# Patient Record
Sex: Female | Born: 1980 | Race: Black or African American | Hispanic: No | Marital: Married | Smoking: Never smoker
Health system: Southern US, Community
[De-identification: ages and names within clinical notes are randomized; demographics above are authoritative.]

## PROBLEM LIST (undated history)

## (undated) ENCOUNTER — Inpatient Hospital Stay (HOSPITAL_COMMUNITY): Payer: Self-pay

## (undated) DIAGNOSIS — K219 Gastro-esophageal reflux disease without esophagitis: Secondary | ICD-10-CM

## (undated) DIAGNOSIS — D649 Anemia, unspecified: Secondary | ICD-10-CM

## (undated) DIAGNOSIS — T50A95A Adverse effect of other bacterial vaccines, initial encounter: Secondary | ICD-10-CM

## (undated) DIAGNOSIS — D573 Sickle-cell trait: Secondary | ICD-10-CM

## (undated) DIAGNOSIS — R7611 Nonspecific reaction to tuberculin skin test without active tuberculosis: Secondary | ICD-10-CM

## (undated) DIAGNOSIS — O4290 Premature rupture of membranes, unspecified as to length of time between rupture and onset of labor, unspecified weeks of gestation: Secondary | ICD-10-CM

## (undated) DIAGNOSIS — N883 Incompetence of cervix uteri: Secondary | ICD-10-CM

## (undated) DIAGNOSIS — Z5189 Encounter for other specified aftercare: Secondary | ICD-10-CM

## (undated) HISTORY — PX: TUBAL LIGATION: SHX77

## (undated) HISTORY — DX: Sickle-cell trait: D57.3

## (undated) HISTORY — PX: CERVICAL CERCLAGE: SHX1329

## (undated) HISTORY — DX: Anemia, unspecified: D64.9

---

## 2004-12-25 ENCOUNTER — Emergency Department (HOSPITAL_COMMUNITY): Admission: EM | Admit: 2004-12-25 | Discharge: 2004-12-25 | Payer: Self-pay | Admitting: Emergency Medicine

## 2005-03-20 ENCOUNTER — Emergency Department (HOSPITAL_COMMUNITY): Admission: EM | Admit: 2005-03-20 | Discharge: 2005-03-20 | Payer: Self-pay | Admitting: Emergency Medicine

## 2005-11-08 ENCOUNTER — Inpatient Hospital Stay (HOSPITAL_COMMUNITY): Admission: AD | Admit: 2005-11-08 | Discharge: 2005-11-13 | Payer: Self-pay | Admitting: Obstetrics and Gynecology

## 2005-11-10 ENCOUNTER — Encounter (INDEPENDENT_AMBULATORY_CARE_PROVIDER_SITE_OTHER): Payer: Self-pay | Admitting: Specialist

## 2006-07-01 ENCOUNTER — Inpatient Hospital Stay (HOSPITAL_COMMUNITY): Admission: AD | Admit: 2006-07-01 | Discharge: 2006-07-11 | Payer: Self-pay | Admitting: Obstetrics and Gynecology

## 2006-07-04 ENCOUNTER — Ambulatory Visit: Payer: Self-pay | Admitting: Neonatology

## 2006-07-10 ENCOUNTER — Encounter (INDEPENDENT_AMBULATORY_CARE_PROVIDER_SITE_OTHER): Payer: Self-pay | Admitting: Specialist

## 2006-07-10 ENCOUNTER — Ambulatory Visit: Payer: Self-pay | Admitting: Neonatology

## 2007-07-05 ENCOUNTER — Inpatient Hospital Stay (HOSPITAL_COMMUNITY): Admission: AD | Admit: 2007-07-05 | Discharge: 2007-07-05 | Payer: Self-pay | Admitting: Obstetrics and Gynecology

## 2007-07-31 ENCOUNTER — Ambulatory Visit (HOSPITAL_COMMUNITY): Admission: RE | Admit: 2007-07-31 | Discharge: 2007-07-31 | Payer: Self-pay | Admitting: Obstetrics and Gynecology

## 2007-09-22 ENCOUNTER — Inpatient Hospital Stay (HOSPITAL_COMMUNITY): Admission: AD | Admit: 2007-09-22 | Discharge: 2007-09-22 | Payer: Self-pay | Admitting: Obstetrics and Gynecology

## 2007-11-13 ENCOUNTER — Inpatient Hospital Stay (HOSPITAL_COMMUNITY): Admission: AD | Admit: 2007-11-13 | Discharge: 2007-11-21 | Payer: Self-pay | Admitting: Obstetrics & Gynecology

## 2007-11-17 ENCOUNTER — Encounter (INDEPENDENT_AMBULATORY_CARE_PROVIDER_SITE_OTHER): Payer: Self-pay | Admitting: Obstetrics and Gynecology

## 2007-11-22 ENCOUNTER — Encounter: Admission: RE | Admit: 2007-11-22 | Discharge: 2007-12-21 | Payer: Self-pay | Admitting: Obstetrics and Gynecology

## 2007-12-22 ENCOUNTER — Encounter: Admission: RE | Admit: 2007-12-22 | Discharge: 2008-01-21 | Payer: Self-pay | Admitting: Obstetrics and Gynecology

## 2008-01-22 ENCOUNTER — Encounter: Admission: RE | Admit: 2008-01-22 | Discharge: 2008-02-20 | Payer: Self-pay | Admitting: Obstetrics and Gynecology

## 2008-02-21 ENCOUNTER — Encounter: Admission: RE | Admit: 2008-02-21 | Discharge: 2008-03-22 | Payer: Self-pay | Admitting: Obstetrics and Gynecology

## 2008-03-23 ENCOUNTER — Encounter: Admission: RE | Admit: 2008-03-23 | Discharge: 2008-04-22 | Payer: Self-pay | Admitting: Obstetrics and Gynecology

## 2008-04-23 ENCOUNTER — Encounter: Admission: RE | Admit: 2008-04-23 | Discharge: 2008-05-23 | Payer: Self-pay | Admitting: Obstetrics and Gynecology

## 2008-05-24 ENCOUNTER — Encounter: Admission: RE | Admit: 2008-05-24 | Discharge: 2008-06-23 | Payer: Self-pay | Admitting: Obstetrics and Gynecology

## 2008-06-24 ENCOUNTER — Encounter: Admission: RE | Admit: 2008-06-24 | Discharge: 2008-07-23 | Payer: Self-pay | Admitting: Obstetrics and Gynecology

## 2008-07-24 ENCOUNTER — Encounter: Admission: RE | Admit: 2008-07-24 | Discharge: 2008-08-23 | Payer: Self-pay | Admitting: Obstetrics and Gynecology

## 2008-08-24 ENCOUNTER — Encounter: Admission: RE | Admit: 2008-08-24 | Discharge: 2008-09-23 | Payer: Self-pay | Admitting: Obstetrics and Gynecology

## 2008-09-18 ENCOUNTER — Emergency Department (HOSPITAL_COMMUNITY): Admission: EM | Admit: 2008-09-18 | Discharge: 2008-09-18 | Payer: Self-pay | Admitting: Emergency Medicine

## 2009-03-03 ENCOUNTER — Emergency Department (HOSPITAL_COMMUNITY): Admission: EM | Admit: 2009-03-03 | Discharge: 2009-03-04 | Payer: Self-pay | Admitting: Emergency Medicine

## 2009-03-03 ENCOUNTER — Emergency Department (HOSPITAL_COMMUNITY): Admission: EM | Admit: 2009-03-03 | Discharge: 2009-03-03 | Payer: Self-pay | Admitting: Emergency Medicine

## 2010-01-28 ENCOUNTER — Ambulatory Visit (HOSPITAL_COMMUNITY)
Admission: RE | Admit: 2010-01-28 | Discharge: 2010-01-28 | Payer: Self-pay | Source: Home / Self Care | Admitting: Obstetrics and Gynecology

## 2010-02-25 ENCOUNTER — Other Ambulatory Visit: Payer: Self-pay | Admitting: Obstetrics and Gynecology

## 2010-02-25 ENCOUNTER — Emergency Department (HOSPITAL_COMMUNITY): Admission: EM | Admit: 2010-02-25 | Discharge: 2010-02-25 | Payer: Self-pay | Admitting: Emergency Medicine

## 2010-02-25 ENCOUNTER — Ambulatory Visit: Payer: Self-pay | Admitting: Nurse Practitioner

## 2010-03-26 ENCOUNTER — Inpatient Hospital Stay (HOSPITAL_COMMUNITY): Admission: AD | Admit: 2010-03-26 | Discharge: 2010-03-26 | Payer: Self-pay | Admitting: Obstetrics and Gynecology

## 2010-03-26 DIAGNOSIS — O9989 Other specified diseases and conditions complicating pregnancy, childbirth and the puerperium: Secondary | ICD-10-CM

## 2010-03-26 DIAGNOSIS — M545 Low back pain: Secondary | ICD-10-CM

## 2010-04-29 ENCOUNTER — Ambulatory Visit (HOSPITAL_COMMUNITY): Admission: RE | Admit: 2010-04-29 | Discharge: 2010-04-29 | Payer: Self-pay | Admitting: Obstetrics and Gynecology

## 2010-05-09 ENCOUNTER — Inpatient Hospital Stay (HOSPITAL_COMMUNITY): Admission: AD | Admit: 2010-05-09 | Discharge: 2010-05-09 | Payer: Self-pay | Admitting: Obstetrics and Gynecology

## 2010-05-09 ENCOUNTER — Ambulatory Visit: Payer: Self-pay | Admitting: Family

## 2010-05-27 ENCOUNTER — Inpatient Hospital Stay (HOSPITAL_COMMUNITY): Admission: AD | Admit: 2010-05-27 | Discharge: 2010-05-27 | Payer: Self-pay | Admitting: Obstetrics and Gynecology

## 2010-05-27 ENCOUNTER — Encounter (HOSPITAL_COMMUNITY): Payer: Self-pay | Admitting: Obstetrics and Gynecology

## 2010-06-06 ENCOUNTER — Ambulatory Visit (HOSPITAL_COMMUNITY)
Admission: RE | Admit: 2010-06-06 | Discharge: 2010-06-06 | Payer: Self-pay | Source: Home / Self Care | Admitting: Obstetrics and Gynecology

## 2010-06-24 ENCOUNTER — Ambulatory Visit (HOSPITAL_COMMUNITY): Admission: RE | Admit: 2010-06-24 | Discharge: 2010-06-24 | Payer: Self-pay | Admitting: Obstetrics and Gynecology

## 2010-07-27 ENCOUNTER — Inpatient Hospital Stay (HOSPITAL_COMMUNITY)
Admission: RE | Admit: 2010-07-27 | Discharge: 2010-07-30 | Payer: Self-pay | Source: Home / Self Care | Attending: Obstetrics and Gynecology | Admitting: Obstetrics and Gynecology

## 2010-07-30 ENCOUNTER — Encounter
Admission: RE | Admit: 2010-07-30 | Discharge: 2010-08-29 | Payer: Self-pay | Source: Home / Self Care | Attending: Obstetrics and Gynecology | Admitting: Obstetrics and Gynecology

## 2010-08-08 ENCOUNTER — Inpatient Hospital Stay (HOSPITAL_COMMUNITY)
Admission: AD | Admit: 2010-08-08 | Discharge: 2010-08-08 | Payer: Self-pay | Source: Home / Self Care | Attending: Obstetrics and Gynecology | Admitting: Obstetrics and Gynecology

## 2010-08-30 ENCOUNTER — Encounter
Admission: RE | Admit: 2010-08-30 | Discharge: 2010-09-13 | Payer: Self-pay | Source: Home / Self Care | Attending: Obstetrics and Gynecology | Admitting: Obstetrics and Gynecology

## 2010-09-30 ENCOUNTER — Encounter (HOSPITAL_COMMUNITY)
Admission: RE | Admit: 2010-09-30 | Discharge: 2010-09-30 | Disposition: A | Discharge status: Home / Self Care | Payer: Self-pay | Source: Ambulatory Visit | Attending: Obstetrics and Gynecology | Admitting: Obstetrics and Gynecology

## 2010-09-30 DIAGNOSIS — O923 Agalactia: Secondary | ICD-10-CM | POA: Insufficient documentation

## 2010-10-24 LAB — ABO/RH: ABO/RH(D): O POS

## 2010-10-24 LAB — CBC
HCT: 25.8 % — ABNORMAL LOW (ref 36.0–46.0)
HCT: 26.6 % — ABNORMAL LOW (ref 36.0–46.0)
Hemoglobin: 10.6 g/dL — ABNORMAL LOW (ref 12.0–15.0)
Hemoglobin: 8.5 g/dL — ABNORMAL LOW (ref 12.0–15.0)
Hemoglobin: 8.7 g/dL — ABNORMAL LOW (ref 12.0–15.0)
MCH: 24.1 pg — ABNORMAL LOW (ref 26.0–34.0)
MCH: 25.4 pg — ABNORMAL LOW (ref 26.0–34.0)
MCHC: 32.7 g/dL (ref 30.0–36.0)
MCHC: 32.9 g/dL (ref 30.0–36.0)
MCHC: 33.2 g/dL (ref 30.0–36.0)
Platelets: 383 10*3/uL (ref 150–400)
RBC: 3.7 MIL/uL — ABNORMAL LOW (ref 3.87–5.11)
RBC: 4.18 MIL/uL (ref 3.87–5.11)
RDW: 16.9 % — ABNORMAL HIGH (ref 11.5–15.5)

## 2010-10-24 LAB — TYPE AND SCREEN
ABO/RH(D): O POS
Antibody Screen: NEGATIVE

## 2010-10-24 LAB — SURGICAL PCR SCREEN
MRSA, PCR: NEGATIVE
Staphylococcus aureus: NEGATIVE

## 2010-10-24 LAB — RPR: RPR Ser Ql: NONREACTIVE

## 2010-10-27 LAB — URINALYSIS, ROUTINE W REFLEX MICROSCOPIC
Ketones, ur: NEGATIVE mg/dL
Leukocytes, UA: NEGATIVE
Nitrite: NEGATIVE
Protein, ur: NEGATIVE mg/dL
Urobilinogen, UA: 0.2 mg/dL (ref 0.0–1.0)
pH: 6 (ref 5.0–8.0)

## 2010-10-27 LAB — WET PREP, GENITAL: Clue Cells Wet Prep HPF POC: NONE SEEN

## 2010-10-28 LAB — URINALYSIS, ROUTINE W REFLEX MICROSCOPIC
Nitrite: NEGATIVE
Specific Gravity, Urine: 1.015 (ref 1.005–1.030)
Urobilinogen, UA: 0.2 mg/dL (ref 0.0–1.0)

## 2010-10-28 LAB — URINE CULTURE

## 2010-10-28 LAB — URINE MICROSCOPIC-ADD ON

## 2010-10-29 LAB — URINALYSIS, ROUTINE W REFLEX MICROSCOPIC
Ketones, ur: 15 mg/dL — AB
Leukocytes, UA: NEGATIVE
Nitrite: NEGATIVE
Specific Gravity, Urine: 1.015 (ref 1.005–1.030)
Urobilinogen, UA: 0.2 mg/dL (ref 0.0–1.0)
pH: 6 (ref 5.0–8.0)

## 2010-10-29 LAB — URINE MICROSCOPIC-ADD ON

## 2010-10-29 LAB — DIFFERENTIAL
Basophils Absolute: 0 10*3/uL (ref 0.0–0.1)
Lymphocytes Relative: 9 % — ABNORMAL LOW (ref 12–46)
Lymphs Abs: 1.3 10*3/uL (ref 0.7–4.0)
Neutro Abs: 11.6 10*3/uL — ABNORMAL HIGH (ref 1.7–7.7)
Neutrophils Relative %: 85 % — ABNORMAL HIGH (ref 43–77)

## 2010-10-29 LAB — COMPREHENSIVE METABOLIC PANEL
AST: 19 U/L (ref 0–37)
BUN: 7 mg/dL (ref 6–23)
CO2: 24 mEq/L (ref 19–32)
Calcium: 9.5 mg/dL (ref 8.4–10.5)
Chloride: 105 mEq/L (ref 96–112)
Creatinine, Ser: 0.52 mg/dL (ref 0.4–1.2)
GFR calc Af Amer: >60 mL/min (ref >60)
GFR calc non Af Amer: >60 mL/min (ref >60)
Glucose, Bld: 93 mg/dL (ref 70–99)
Total Bilirubin: 0.4 mg/dL (ref 0.3–1.2)

## 2010-10-29 LAB — CBC
Hemoglobin: 11.3 g/dL — ABNORMAL LOW (ref 12.0–15.0)
MCH: 27.7 pg (ref 26.0–34.0)
MCHC: 33.8 g/dL (ref 30.0–36.0)
MCV: 82.1 fL (ref 78.0–100.0)

## 2010-10-29 LAB — T4, FREE: Free T4: 1.08 ng/dL (ref 0.80–1.80)

## 2010-10-29 LAB — TSH: TSH: 1.185 u[IU]/mL (ref 0.350–4.500)

## 2010-10-30 LAB — CBC
Hemoglobin: 11.4 g/dL — ABNORMAL LOW (ref 12.0–15.0)
RBC: 4.12 MIL/uL (ref 3.87–5.11)
WBC: 12.6 10*3/uL — ABNORMAL HIGH (ref 4.0–10.5)

## 2010-10-31 ENCOUNTER — Encounter (HOSPITAL_COMMUNITY)
Admission: RE | Admit: 2010-10-31 | Discharge: 2010-10-31 | Disposition: A | Discharge status: Home / Self Care | Payer: Self-pay | Source: Ambulatory Visit | Attending: Obstetrics and Gynecology | Admitting: Obstetrics and Gynecology

## 2010-10-31 DIAGNOSIS — O923 Agalactia: Secondary | ICD-10-CM | POA: Insufficient documentation

## 2010-11-13 ENCOUNTER — Emergency Department (HOSPITAL_COMMUNITY): Payer: BC Managed Care – PPO

## 2010-11-13 ENCOUNTER — Emergency Department (HOSPITAL_COMMUNITY)
Admission: EM | Admit: 2010-11-13 | Discharge: 2010-11-14 | Disposition: A | Discharge status: Home / Self Care | Payer: BC Managed Care – PPO | Attending: Emergency Medicine | Admitting: Emergency Medicine

## 2010-11-13 ENCOUNTER — Encounter (HOSPITAL_COMMUNITY): Payer: Self-pay | Admitting: Radiology

## 2010-11-13 DIAGNOSIS — R0602 Shortness of breath: Secondary | ICD-10-CM | POA: Insufficient documentation

## 2010-11-13 DIAGNOSIS — R799 Abnormal finding of blood chemistry, unspecified: Secondary | ICD-10-CM | POA: Insufficient documentation

## 2010-11-13 DIAGNOSIS — R072 Precordial pain: Secondary | ICD-10-CM | POA: Insufficient documentation

## 2010-11-13 DIAGNOSIS — D573 Sickle-cell trait: Secondary | ICD-10-CM | POA: Insufficient documentation

## 2010-11-13 DIAGNOSIS — I10 Essential (primary) hypertension: Secondary | ICD-10-CM | POA: Insufficient documentation

## 2010-11-13 DIAGNOSIS — M25519 Pain in unspecified shoulder: Secondary | ICD-10-CM | POA: Insufficient documentation

## 2010-11-13 DIAGNOSIS — Z79899 Other long term (current) drug therapy: Secondary | ICD-10-CM | POA: Insufficient documentation

## 2010-11-13 LAB — URINALYSIS, ROUTINE W REFLEX MICROSCOPIC
Ketones, ur: NEGATIVE mg/dL
Leukocytes, UA: NEGATIVE
Nitrite: NEGATIVE
Protein, ur: NEGATIVE mg/dL
Urobilinogen, UA: 0.2 mg/dL (ref 0.0–1.0)

## 2010-11-13 LAB — DIFFERENTIAL
Basophils Absolute: 0 10*3/uL (ref 0.0–0.1)
Basophils Relative: 1 % (ref 0–1)
Eosinophils Absolute: 0.1 10*3/uL (ref 0.0–0.7)
Monocytes Relative: 7 % (ref 3–12)
Neutro Abs: 4.4 10*3/uL (ref 1.7–7.7)
Neutrophils Relative %: 54 % (ref 43–77)

## 2010-11-13 LAB — POCT PREGNANCY, URINE: Preg Test, Ur: NEGATIVE

## 2010-11-13 LAB — BASIC METABOLIC PANEL
CO2: 25 mEq/L (ref 19–32)
Calcium: 9.7 mg/dL (ref 8.4–10.5)
Chloride: 101 mEq/L (ref 96–112)
Creatinine, Ser: 0.8 mg/dL (ref 0.4–1.2)
GFR calc Af Amer: >60 mL/min (ref >60)
Sodium: 135 mEq/L (ref 135–145)

## 2010-11-13 LAB — CBC
Hemoglobin: 11.6 g/dL — ABNORMAL LOW (ref 12.0–15.0)
MCH: 24.2 pg — ABNORMAL LOW (ref 26.0–34.0)
MCHC: 34.1 g/dL (ref 30.0–36.0)
Platelets: 419 10*3/uL — ABNORMAL HIGH (ref 150–400)
RBC: 4.79 MIL/uL (ref 3.87–5.11)

## 2010-11-13 LAB — POCT CARDIAC MARKERS
CKMB, poc: 1.2 ng/mL (ref 1.0–8.0)
Myoglobin, poc: 65.8 ng/mL (ref 12–200)

## 2010-11-13 LAB — URINE MICROSCOPIC-ADD ON

## 2010-11-13 MED ORDER — IOHEXOL 300 MG/ML  SOLN
100.0000 mL | Freq: Once | INTRAMUSCULAR | Status: AC | PRN
  Administered 2010-11-13: 100 mL via INTRAVENOUS

## 2010-11-20 LAB — URINALYSIS, ROUTINE W REFLEX MICROSCOPIC
Bilirubin Urine: NEGATIVE
Protein, ur: NEGATIVE mg/dL
Urobilinogen, UA: 0.2 mg/dL (ref 0.0–1.0)

## 2010-11-20 LAB — POCT PREGNANCY, URINE: Preg Test, Ur: NEGATIVE

## 2010-12-01 ENCOUNTER — Encounter (HOSPITAL_COMMUNITY)
Admission: RE | Admit: 2010-12-01 | Discharge: 2010-12-01 | Disposition: A | Discharge status: Home / Self Care | Payer: Self-pay | Source: Ambulatory Visit | Attending: Obstetrics and Gynecology | Admitting: Obstetrics and Gynecology

## 2010-12-01 DIAGNOSIS — O923 Agalactia: Secondary | ICD-10-CM | POA: Insufficient documentation

## 2010-12-27 NOTE — Discharge Summary (Signed)
Sonya Harrison, Sonya Harrison                  ACCOUNT NO.:  [id number]   MEDICAL RECORD NO.:  [id number]          PATIENT TYPE:  INP   LOCATION:  9309                          FACILITY:  [facility]   PHYSICIAN:  Zelphia Cairo, MD    DATE OF BIRTH:  19-May-1981   DATE OF ADMISSION:  11/13/2007  DATE OF DISCHARGE:  11/21/2007                               DISCHARGE SUMMARY   ADMITTING DIAGNOSES:  1. Intrauterine pregnancy at 39 weeks' estimated gestational age.  2. Preterm labor.  3. Incompetent cervix.   DISCHARGE DIAGNOSES:  1. Status post repeat low transverse cesarean section.  2. Viable female infant.   PROCEDURE:  1. Repeat low transverse cesarean section.  2. Removal of cervical cerclage.   REASON FOR ADMISSION:  Please see written H&P.   HOSPITAL COURSE:  The patient is 30 year old white married female  gravida 3, para 1 that was admitted to [facility] with  the onset of bright red bleeding and uterine contractions.  The  patient's history was significant for a second trimester pregnancy loss.  The patient did have known incompetent cervix with the cervical  cerclage.  Ultrasound was obtained which revealed normal fetal growth  normal amniotic fluid.  Stitch was in place.  Fetal heart tones were  reactive for 29 weeks' gestation.  The patient's blood type was known to  be O+.  Fetal heart tones were reactive.  The patient was now admitted  for magnesium sulfate and betamethasone administration.  On the  following morning, contractions were very irregular.  She was afebrile.  Fetal heart tones were in the 140s.  Cervix was examined and found to be  fingertip dilated, 90% of effaced with questionable presentation.  Ultrasound was performed for presentation.  She continued on magnesium  sulfate and betamethasone was completed.  Later that afternoon,  contractions were very irregular, now occurring 11-15 minutes.  Magnesium sulfate was decreased to 2 g/hr and the patient was  started on  p.o. Indocin every 6 hours.  On the following morning, the patient was  doing well.  Vital signs were stable.  Contractions were 1-2 per hour.  Fetal heart tones were in the 140s without deceleration.  Decision was  made to wean the patient from the magnesium sulfate; however, during the  night, IV had been infiltrated and discontinued for approximately 2  hours and the patient continued to have noted that morning contractions.  IV was restarted.  The magnesium sulfate was re-administered; however,  it was unable to control the contraction pattern.  The patient began to  have regular strong contractions occurring every 3-5 minutes.  She was  given Stadol for pain.  There was some spotting that was noted.  The  cervical cerclage was still in place; however, the patient began to have  pain not only with the contractions but also intermittently.  Decision  was made to proceed with a repeat cesarean delivery secondary to  nonresponsive to the magnesium sulfate.  Fetal heart tones continued to  be reactive throughout the whole process.  The patient was then  transferred  to the operating room, where spinal anesthesia was  administered without difficulty.  A low transverse incision was made  with delivery of a viable female infant with Apgars of 8 at 1 minute and  9 at 5 minutes.  Arterial cord pH was 7.32.  There was some blood and  clot that was noted within the amniotic sac and also a clot noted at the  edge of placenta consistent with a marginal abruption approximately 20%.  The patient tolerated the procedure well and was then taken to the  recovery room in stable condition.  On postoperative day #1, the patient  was without complaint.  Vital signs remained stable.  She was afebrile.  Fundus firm and nontender.  Abdominal dressing was noted to be clean,  dry, and intact.  On postoperative day #2, the patient was without  complaint.  Vital signs were stable.  Abdomen soft.  Fundus  firm and  nontender.  Incision was noted to have a scant amount of drainage noted  on the right margin of the incision.  She was ambulating well.  Laboratory findings revealed hemoglobin of 8.6, platelet count of  141,000, WBC count of 16.5.  The patient was started on some iron  supplementation and CBC was ordered for the following morning.  On  postoperative day #3, the patient was without complaint.  Baby was  stable in the NICU on room air.  Vital signs were stable.  Blood  pressure 117/82-122/82.  Deep tendon reflexes were 1+, no clonus.  Abdomen was soft.  Fundus firm and nontender.  Incision was clean, dry,  and intact.  On postoperative day #4, the patient was without complaint.  Baby remained stable in the NICU.  Vital signs were stable.  Fundus firm  and nontender.  Incision was clean, dry, and intact.  Staples were  removed and the patient was later discharged home.   CONDITION ON DISCHARGE:  Good.   DIET:  Regular as tolerated.   ACTIVITY:  No heavy lifting, no driving x2 weeks, no vaginal entry.   FOLLOW-UP:  The patient to follow up in the office in 1-2 weeks for  incision check.  She is to call for temperature greater than 100  degrees, persistent nausea, vomiting, heavy vaginal bleeding, and/or  redness or drainage from the incisional site.   DISCHARGE MEDICATIONS:  1. Tylox #30 one p.o. q.4-6 h p.r.n.  2. Motrin 600 mg every 6 hours.  3. Prenatal vitamins 1 p.o. daily.  4. Iron supplementation 1 p.o. daily.  5. Colace as needed.      Julio Sicks, N.P.      Zelphia Cairo, MD  Electronically Signed    CC/MEDQ  D:  12/09/2007  T:  12/09/2007  Job:  [phone]

## 2010-12-27 NOTE — Op Note (Signed)
Sonya Harrison, Sonya Harrison                  ACCOUNT NO.:  [id number]   MEDICAL RECORD NO.:  [id number]          PATIENT TYPE:  AMB   LOCATION:  SDC                           FACILITY:  [facility]   PHYSICIAN:  Duke Salvia. [name], M.D.DATE OF BIRTH:  30-May-1981   DATE OF PROCEDURE:  07/31/2007  DATE OF DISCHARGE:                               OPERATIVE REPORT   PREOPERATIVE DIAGNOSES:  1. 14-week intrauterine pregnancy.  2. Incompetent cervix.   POSTOPERATIVE DIAGNOSES:  1. 14-week intrauterine pregnancy.  2. Incompetent cervix.   PROCEDURE:  McDonald cerclage.   SURGEON:  Duke Salvia. [name], M.D.   ANESTHESIA:  Spinal.   COMPLICATIONS:  None.   DRAINS:  In and out Foley catheter.   BLOOD LOSS:  Minimal.   PROCEDURE AND FINDINGS:  The patient taken to the operating room.  After  an adequate level of spinal anesthetic was obtained with the patient  legs in stirrups, perineum and vagina prepped and draped with Betadine  and the bladder was drained.  Weighted speculum was positioned.  A ring  forceps then used to gently manipulate the anterior cervical lip.  Starting at 11 o'clock a #2 Tevdek suture was placed in pursestring  fashion at the cervicovaginal mucosal junction and brought around in  separate bites through to 12 o'clock and tied down.  Excellent  hemostasis at that point.  The bladder was recatheterized and noted to  be clear.  She tolerated this well, went to recovery room in good  condition.      Richard M. [name], M.D.  Electronically Signed     RMH/MEDQ  D:  07/31/2007  T:  08/01/2007  Job:  [id number]

## 2010-12-27 NOTE — H&P (Signed)
Sonya Harrison, Sonya Harrison                  ACCOUNT NO.:  [id number]   MEDICAL RECORD NO.:  [id number]          PATIENT TYPE:  AMB   LOCATION:  SDC                           [facility]  [facility]   PHYSICIAN:  Duke Salvia. [name], M.D.DATE OF BIRTH:  [birth year]   DATE OF ADMISSION:  DATE OF DISCHARGE:                              HISTORY & PHYSICAL   CHIEF COMPLAINT:  A 14-week pregnancy, history of incompetent cervix.   HISTORY OF PRESENT ILLNESS:  A 30 year old G32, P1-1-1-1 who has had one  prior cesarean at term in 2007.  In November 2007, at [redacted] weeks  gestation, she presented with bulging membranes followed by rupture of  membranes and delivery.  She did require D&C for removal of  placenta.  Pathology did show amnionitis.  Presentation was consistent with  incompetent cervix.  She presents now for McDonald cerclage.  This  procedure including risks of bleeding, infection, rupture of membranes,  premature labor all reviewed with her which she understands and accepts.   CURRENT MEDICATIONS:  Prenatal vitamins.   FAMILY HISTORY:  Please see her [facility] form for complete details.   REVIEW OF SYSTEMS:  Significant for positive sickle cell trait.   PHYSICAL EXAMINATION:  VITAL SIGNS:  Temperature 98.2, blood pressure  120/68.  HEENT:  Unremarkable.  NECK:  Supple without masses.  LUNGS:  Clear.  CARDIOVASCULAR:  Regular rate and rhythm without murmurs, rubs, or  gallops noted.  BREASTS:  Without masses.  ABDOMEN:  Soft, flat and nontender.  Uterus 14-week size.  Fetal heart  rate 140.  PELVIC:  Cervix closed.  EXTREMITIES:  Unremarkable.  NEUROLOGIC:  Unremarkable.   IMPRESSION:  1. A 14-week intrauterine pregnancy.  2. History of incompetent cervix, previous cesarean section.   PLAN:  McDonald cerclage.  Procedure and risks reviewed as above.      Richard M. [name], M.D.  Electronically Signed    RMH/MEDQ  D:  07/30/2007  T:  07/30/2007  Job:  [id number]

## 2010-12-27 NOTE — Op Note (Signed)
Sonya Harrison, Sonya Harrison                  ACCOUNT NO.:  [id number]   MEDICAL RECORD NO.:  [id number]          PATIENT TYPE:  INP   LOCATION:  9168                          FACILITY:  [facility]   PHYSICIAN:  Dineen Kid. Rana Snare, M.D.    DATE OF BIRTH:  10-14-1980   DATE OF PROCEDURE:  11/17/2007  DATE OF DISCHARGE:                               OPERATIVE REPORT   PREOPERATIVE DIAGNOSES:  Intrauterine pregnancy at 29-6/7 weeks, preterm  labor not responsive to tocolysis, history of incompetent cervix,  previous cesarean section.   POSTOPERATIVE DIAGNOSES:  Intrauterine pregnancy at 29-6/7 weeks,  preterm labor not responsive to tocolysis, history of incompetent  cervix, previous cesarean section, plus marginal abruption.   PROCEDURE:  Repeat low segment transverse cesarean section and removal  of cervical cerclage.   SURGEON:  Dineen Kid. Rana Snare, M.D.   ANESTHESIA:  Spinal.   INDICATIONS:  Sonya Harrison is a 30 year old G3, P1 with a history of  incompetent cervix and a second trimester loss.  She presented with  bright red bleeding on November 13, 2007, thought to be due to cervical  change.  She had effaced nearly 100% with a cerclage that was placed  earlier in this pregnancy still in place.  She underwent magnesium  tocolysis and betamethasone therapy.  She was also placed on Indocin.  She did have some quiescence of the contractions, however, last night  the IV had been infiltrated and was discontinued for approximately 2  hours.  She began contracting more.  Once they restarted the IV the  magnesium sulfate was unable to control the contractions.  She began  having regular strong contractions every 3-5 minutes.  Was also given  Stadol for pain, had some spotting, the cervical cerclage was still in  place, now beginning to have pain not only with contractions but in  between the contractions.  Plan to proceed with repeat cesarean section  due to preterm labor not responsive to magnesium sulfate.  The  fetal  heart rate remained reassuring throughout this.  The risks and benefits  were discussed and informed consent was obtained.   FINDINGS AT SURGERY:  Viable female infant, Apgars were 8 and 9, pH  arterial 7.32.  There was some blood and clot noted within the amniotic  sac and also clot noted at the edge of the placenta consistent with a  marginal abruption approximately 20%.   DESCRIPTION OF PROCEDURE:  After adequate analgesia the patient was  placed in the supine position with left lateral tilt.  She was sterilely  prepped and draped.  Bladder was sterilely drained with Foley catheter.  Unable to remove the cerclage at this point due to positioning.  A  Pfannenstiel skin incision was then made two fingerbreadths above the  pubic symphysis and taken down sharply to fascia which was incised  transversely and extended superiorly and inferiorly.  The bellies of the  rectus muscle were separated sharply in midline.  Peritoneum was entered  sharply.  Bladder flap created and placed behind bladder blade.  Low  segment myotomy incision was made down  to the amniotic sac.  Clear fluid  with some clots and blood staining were noted.  Nuchal cord x2 was  reduced.  The infant was then delivered, cord clamped, cut and handed to  pediatrician for resuscitation.  Cord blood was obtained.  Placenta  extracted manually.  Again a 20% clot was noted.  Remaining portion of  placenta was normal and this was sent to pathology.  Uterus was then  exteriorized, wiped clean with a dry lap.  The myotomy incision closed  in two layers, first being a running locking and the second being an  imbricating layer of zero Monocryl suture.  Uterus placed back in the  peritoneal cavity and after copious amount of irrigation adequate  hemostasis was assured.  The peritoneum was then closed with zero  Monocryl suture.  Rectus muscle plicated in midline.  Irrigation applied  and after adequate hemostasis the fascia was  closed with #1 Vicryl with  good approximation and good hemostasis noted.  The skin was then  stapled, Steri-Strips applied.  The patient tolerated the procedure well  and was stable on transfer to the recovery room.  The legs were  repositioned before transfer to the recovery room.  The cerclage was  easily removed with Mayo scissors.  The patient had been on Ancef  throughout her hospital course due to GBS positive and preterm labor.  She received her last dose 2 hours before the surgery.  Estimated blood  loss was 600 mL.  The baby was stable on transfer to the NICU.  The pH  was 7.32.      Dineen Kid Rana Snare, M.D.  Electronically Signed     DCL/MEDQ  D:  11/17/2007  T:  11/17/2007  Job:  [id number]

## 2011-01-01 ENCOUNTER — Encounter (HOSPITAL_COMMUNITY)
Admission: RE | Admit: 2011-01-01 | Discharge: 2011-01-01 | Disposition: A | Discharge status: Home / Self Care | Payer: Self-pay | Source: Ambulatory Visit | Attending: Obstetrics and Gynecology | Admitting: Obstetrics and Gynecology

## 2011-01-01 DIAGNOSIS — O923 Agalactia: Secondary | ICD-10-CM | POA: Insufficient documentation

## 2011-04-08 ENCOUNTER — Emergency Department (HOSPITAL_COMMUNITY): Payer: BC Managed Care – PPO

## 2011-04-08 ENCOUNTER — Observation Stay (HOSPITAL_COMMUNITY)
Admission: EM | Admit: 2011-04-08 | Discharge: 2011-04-09 | Disposition: A | Discharge status: Home / Self Care | Payer: BC Managed Care – PPO | Attending: Surgery | Admitting: Surgery

## 2011-04-08 DIAGNOSIS — D573 Sickle-cell trait: Secondary | ICD-10-CM | POA: Insufficient documentation

## 2011-04-08 DIAGNOSIS — K3580 Unspecified acute appendicitis: Secondary | ICD-10-CM

## 2011-04-08 DIAGNOSIS — R1031 Right lower quadrant pain: Secondary | ICD-10-CM

## 2011-04-08 HISTORY — PX: APPENDECTOMY: SHX54

## 2011-04-08 LAB — URINALYSIS, ROUTINE W REFLEX MICROSCOPIC
Bilirubin Urine: NEGATIVE
Nitrite: NEGATIVE
Specific Gravity, Urine: 1.005 (ref 1.005–1.030)
pH: 6 (ref 5.0–8.0)

## 2011-04-08 LAB — DIFFERENTIAL
Lymphocytes Relative: 37 % (ref 12–46)
Lymphs Abs: 3.6 10*3/uL (ref 0.7–4.0)
Neutro Abs: 5.3 10*3/uL (ref 1.7–7.7)
Neutrophils Relative %: 54 % (ref 43–77)

## 2011-04-08 LAB — CBC
HCT: 34.3 % — ABNORMAL LOW (ref 36.0–46.0)
MCV: 75.1 fL — ABNORMAL LOW (ref 78.0–100.0)
Platelets: 483 10*3/uL — ABNORMAL HIGH (ref 150–400)
RBC: 4.57 MIL/uL (ref 3.87–5.11)
WBC: 9.7 10*3/uL (ref 4.0–10.5)

## 2011-04-08 LAB — POCT PREGNANCY, URINE: Preg Test, Ur: NEGATIVE

## 2011-04-08 LAB — URINE MICROSCOPIC-ADD ON

## 2011-04-08 LAB — BASIC METABOLIC PANEL
BUN: 11 mg/dL (ref 6–23)
CO2: 29 mEq/L (ref 19–32)
Chloride: 99 mEq/L (ref 96–112)
Creatinine, Ser: 0.68 mg/dL (ref 0.50–1.10)

## 2011-04-08 NOTE — Op Note (Signed)
Sonya Harrison, Sonya Harrison NO.:  [id number]  MEDICAL RECORD NO.:  [id number]  LOCATION:  5114                         FACILITY:  [facility]  PHYSICIAN:  Wilmon Arms. Corliss Skains, M.D. DATE OF BIRTH:  03/24/1981  DATE OF PROCEDURE:  04/08/2011 DATE OF DISCHARGE:                              OPERATIVE REPORT   PREOPERATIVE DIAGNOSIS:  Early acute appendicitis.  POSTOPERATIVE DIAGNOSIS:  Early acute appendicitis.  PROCEDURE:  Laparoscopic appendectomy.  SURGEON:  Wilmon Arms. Corliss Skains, MD  ANESTHESIA:  General.  INDICATIONS:  This is a 30 year old female presents with a 2-day history of worsening right lower quadrant abdominal pain.  She presented to emergency department for evaluation.  White count was noted to be normal.  The patient refused IV contrast due to previous dye sensitivity.  She had a noncontrast CT scan which showed that her appendix was enlarged.  There was no obvious inflammation on this noncontrast CT scan.  Pelvic ultrasound showed no problems with her ovaries or uterus.  Due to these findings and continued tenderness, she presents now for laparoscopic appendectomy.  DESCRIPTION OF PROCEDURE:  The patient was brought to the operating room and placed supine position on operating room table.  After an adequate level of general anesthesia was obtained, the patient's abdomen was prepped with ChloraPrep and draped in sterile fashion.  Time-out was taken to the patient proper procedure.  We infiltrated the area above the umbilicus with 0.25% Marcaine with epinephrine.  A transverse incision was made.  Dissection was carried down to the fascia.  We opened the fascia vertically.  We entered the peritoneal cavity bluntly. A stay suture of 0 Vicryl was placed around the fascial opening.  The Hasson cannula was inserted and secured to stay suture. Pneumoperitoneum was obtained by insufflating CO2 maintaining maximal pressure of 15 mmHg.  The laparoscope was inserted and  the patient was positioned in Trendelenburg tilted to her left.  A 5-mm port was placed in the right upper quadrant.  A 5-mm port was placed in the left lower quadrant.  The scope was moved to the right upper quadrant port site. We mobilized the cecum medially.  There was some attachments laterally which were divided with the Harmonic scalpel.  We identified the appendix.  This appeared to be minimally enlarged and there was no purulence or perforation.  The serosa did seem to be rather injected compared to the surrounding bowel.  There were lot of adhesions to this area.  We took the adhesions down with Harmonic scalpel.  We divided the mesoappendix with the Harmonic.  Once we had cleared the appendix all the way back to its base, we divided this with Endo-GIA stapler.  The appendix was placed in EndoCatch sac and removed through the umbilical port site.  We inspected both ovaries.  There were tiny cysts on the right ovary but no of these appeared to be complicated.  The uterus appears normal.  We irrigated the pelvis thoroughly.  We inspected our staple line and cauterized it tiny oozing area.  Pneumoperitoneum was then released as removed the trocars.  A 4-0 Monocryl was used to close skin incisions.  We closed the fascia with the  pursestring suture.  Steri-Strips and clean dressings were applied.  The patient was extubated, brought to recovery room in stable condition.  All sponge, instrument, and needle counts were correct.     Wilmon Arms. Corliss Skains, M.D.     MKT/MEDQ  D:  04/08/2011  T:  04/08/2011  Job:  [id number]  Electronically Signed by Manus Rudd M.D. on 04/08/2011 08:19:30 PM

## 2011-04-10 ENCOUNTER — Other Ambulatory Visit (INDEPENDENT_AMBULATORY_CARE_PROVIDER_SITE_OTHER): Payer: Self-pay | Admitting: Surgery

## 2011-04-25 ENCOUNTER — Encounter (INDEPENDENT_AMBULATORY_CARE_PROVIDER_SITE_OTHER): Payer: Self-pay | Admitting: General Surgery

## 2011-04-25 ENCOUNTER — Ambulatory Visit (INDEPENDENT_AMBULATORY_CARE_PROVIDER_SITE_OTHER): Payer: BC Managed Care – PPO | Admitting: General Surgery

## 2011-04-25 VITALS — BP 122/82 | HR 76 | Ht 64.0 in | Wt 172.8 lb

## 2011-04-25 DIAGNOSIS — Z9889 Other specified postprocedural states: Secondary | ICD-10-CM

## 2011-04-25 DIAGNOSIS — K3580 Unspecified acute appendicitis: Secondary | ICD-10-CM

## 2011-04-25 NOTE — Progress Notes (Signed)
Sonya Harrison 06-17-1981 [id number] 04/25/2011   History of Present Illness: Sonya Harrison is a  30 y.o. female who presents today status post lap appy.  Pathology reveals mild acute appendicitis.  The patient is tolerating a regular diet, having normal bowel movements, has good pain control.  She  is back to most normal activities.   Physical Exam: Abd: soft, nontender, active bowel sounds, nondistended.  All incisions are well healed.  Impression: 1.  Acute appendicitis, s/p lap appy  Plan: She  is able to return to normal activities. She  may follow up on a prn basis.

## 2011-04-25 NOTE — Patient Instructions (Signed)
Follow up prn

## 2011-05-05 LAB — WET PREP, GENITAL
Clue Cells Wet Prep HPF POC: NONE SEEN
Trich, Wet Prep: NONE SEEN
Yeast Wet Prep HPF POC: NONE SEEN

## 2011-05-05 LAB — URINALYSIS, ROUTINE W REFLEX MICROSCOPIC
Bilirubin Urine: NEGATIVE
Ketones, ur: NEGATIVE
Protein, ur: NEGATIVE
Urobilinogen, UA: 0.2

## 2011-05-05 LAB — URINE MICROSCOPIC-ADD ON

## 2011-05-09 LAB — CBC
HCT: 24.7 — ABNORMAL LOW
HCT: 25.7 — ABNORMAL LOW
Hemoglobin: 10.8 — ABNORMAL LOW
Hemoglobin: 8.5 — ABNORMAL LOW
Hemoglobin: 8.6 — ABNORMAL LOW
MCHC: 33.7
MCV: 78.2
MCV: 78.3
MCV: 78.7
Platelets: 453 — ABNORMAL HIGH
Platelets: 491 — ABNORMAL HIGH
Platelets: 535 — ABNORMAL HIGH
RBC: 3.27 — ABNORMAL LOW
RBC: 3.86 — ABNORMAL LOW
RBC: 4.09
RDW: 15.8 — ABNORMAL HIGH
WBC: 12 — ABNORMAL HIGH
WBC: 16.5 — ABNORMAL HIGH
WBC: 17.4 — ABNORMAL HIGH
WBC: 17.9 — ABNORMAL HIGH

## 2011-05-09 LAB — STREP B DNA PROBE: Strep Group B Ag: POSITIVE

## 2011-05-09 LAB — MAGNESIUM
Magnesium: 5.8 — ABNORMAL HIGH
Magnesium: 6.1
Magnesium: 6.2
Magnesium: 6.9

## 2011-05-09 LAB — RPR: RPR Ser Ql: NONREACTIVE

## 2011-05-19 LAB — CBC
Hemoglobin: 11.8 — ABNORMAL LOW
MCHC: 34.6
Platelets: 447 — ABNORMAL HIGH
RDW: 15.7 — ABNORMAL HIGH

## 2011-05-23 LAB — URINALYSIS, ROUTINE W REFLEX MICROSCOPIC
Bilirubin Urine: NEGATIVE
Nitrite: NEGATIVE
Protein, ur: NEGATIVE
Specific Gravity, Urine: 1.015
Urobilinogen, UA: 0.2

## 2011-05-23 LAB — URINE MICROSCOPIC-ADD ON

## 2011-05-23 LAB — POCT PREGNANCY, URINE: Operator id: 23932

## 2011-08-14 ENCOUNTER — Encounter (INDEPENDENT_AMBULATORY_CARE_PROVIDER_SITE_OTHER): Payer: BC Managed Care – PPO

## 2011-08-14 DIAGNOSIS — Z0000 Encounter for general adult medical examination without abnormal findings: Secondary | ICD-10-CM

## 2011-08-14 DIAGNOSIS — J069 Acute upper respiratory infection, unspecified: Secondary | ICD-10-CM

## 2011-08-14 DIAGNOSIS — I10 Essential (primary) hypertension: Secondary | ICD-10-CM

## 2011-09-26 ENCOUNTER — Encounter (HOSPITAL_COMMUNITY): Payer: Self-pay | Admitting: *Deleted

## 2011-09-26 ENCOUNTER — Emergency Department (HOSPITAL_COMMUNITY): Payer: BC Managed Care – PPO

## 2011-09-26 ENCOUNTER — Emergency Department (HOSPITAL_COMMUNITY)
Admission: EM | Admit: 2011-09-26 | Discharge: 2011-09-26 | Disposition: A | Discharge status: Home / Self Care | Payer: BC Managed Care – PPO | Attending: Emergency Medicine | Admitting: Emergency Medicine

## 2011-09-26 ENCOUNTER — Other Ambulatory Visit: Payer: Self-pay

## 2011-09-26 DIAGNOSIS — I10 Essential (primary) hypertension: Secondary | ICD-10-CM | POA: Insufficient documentation

## 2011-09-26 DIAGNOSIS — R5381 Other malaise: Secondary | ICD-10-CM | POA: Insufficient documentation

## 2011-09-26 DIAGNOSIS — R42 Dizziness and giddiness: Secondary | ICD-10-CM | POA: Insufficient documentation

## 2011-09-26 DIAGNOSIS — R002 Palpitations: Secondary | ICD-10-CM | POA: Insufficient documentation

## 2011-09-26 LAB — D-DIMER, QUANTITATIVE: D-Dimer, Quant: 0.87 ug/mL-FEU — ABNORMAL HIGH (ref 0.00–0.48)

## 2011-09-26 LAB — RAPID URINE DRUG SCREEN, HOSP PERFORMED
Amphetamines: NOT DETECTED
Benzodiazepines: NOT DETECTED
Opiates: NOT DETECTED
Tetrahydrocannabinol: NOT DETECTED

## 2011-09-26 LAB — URINALYSIS, ROUTINE W REFLEX MICROSCOPIC
Bilirubin Urine: NEGATIVE
Glucose, UA: NEGATIVE mg/dL
Ketones, ur: NEGATIVE mg/dL
Protein, ur: NEGATIVE mg/dL
Urobilinogen, UA: 0.2 mg/dL (ref 0.0–1.0)

## 2011-09-26 LAB — URINE MICROSCOPIC-ADD ON

## 2011-09-26 LAB — POCT I-STAT, CHEM 8
BUN: 14 mg/dL (ref 6–23)
Calcium, Ion: 1.25 mmol/L (ref 1.12–1.32)
Creatinine, Ser: 0.9 mg/dL (ref 0.50–1.10)
Glucose, Bld: 102 mg/dL — ABNORMAL HIGH (ref 70–99)
TCO2: 32 mmol/L (ref 0–100)

## 2011-09-26 LAB — CBC
HCT: 37.3 % (ref 36.0–46.0)
Hemoglobin: 12.3 g/dL (ref 12.0–15.0)
RBC: 4.82 MIL/uL (ref 3.87–5.11)

## 2011-09-26 LAB — POTASSIUM: Potassium: 3.8 mEq/L (ref 3.5–5.1)

## 2011-09-26 LAB — TSH: TSH: 1.343 u[IU]/mL (ref 0.350–4.500)

## 2011-09-26 MED ORDER — METHYLPREDNISOLONE SODIUM SUCC 125 MG IJ SOLR
125.0000 mg | Freq: Once | INTRAMUSCULAR | Status: AC
  Administered 2011-09-26: 125 mg via INTRAVENOUS
  Filled 2011-09-26: qty 2

## 2011-09-26 MED ORDER — IOHEXOL 300 MG/ML  SOLN
100.0000 mL | Freq: Once | INTRAMUSCULAR | Status: AC | PRN
  Administered 2011-09-26: 100 mL via INTRAVENOUS

## 2011-09-26 MED ORDER — DIPHENHYDRAMINE HCL 50 MG/ML IJ SOLN
50.0000 mg | Freq: Once | INTRAMUSCULAR | Status: AC
  Administered 2011-09-26: 50 mg via INTRAVENOUS
  Filled 2011-09-26: qty 1

## 2011-09-26 NOTE — ED Provider Notes (Signed)
Lab and radiology results reviewed and discussed with patient and with Dr. Ethelda Chick.  TSH pending, will not be available until tomorrow.  Patient feeling better at present.  Patient to follow-up with Dr. Hal Hope.  Jimmye Norman, NP 09/26/11 2118

## 2011-09-26 NOTE — ED Notes (Signed)
Received pt from work with c/o palpitations while at rest. Upon arrival of EMS found pt heart rate to be 138-143. Pt denies chest pain or shortness of breath. Pt had similar episode during pregnancy in the past.

## 2011-09-26 NOTE — ED Provider Notes (Signed)
History     CSN: [id number]  Arrival date & time 09/26/11  1425   First MD Initiated Contact with Patient 09/26/11 1507      Chief Complaint  Patient presents with  . Palpitations    HR 130's while at rest    (Consider location/radiation/quality/duration/timing/severity/associated sxs/prior treatment) HPI Developed sudden onset of rapid heartbeat one hour ago. Felt lightheaded for approximately 2 or 3 minutes no other complaint no treatment prior to coming here feels improved since arrival here. Patient had similar episode while pregnant which resolved spontaneously. No chest pain no shortness of breath no nausea or vomiting no bowel pain no headache no fever Past Medical History  Diagnosis Date  . Hypertension   . Anemia   . Palpitation     1 time during pregnancy   No history of hypertension anemia CHF collagen vascular disease multiple myeloma renal insufficiency or sickle cell disease in contradiction to nurse's notes Past Surgical History  Procedure Date  . Cesarean section 03/07, 04/09, 12/11  . Appendectomy 04/08/2011    History reviewed. No pertinent family history.  History  Substance Use Topics  . Smoking status: Never Smoker   . Smokeless tobacco: Not on file  . Alcohol Use: Yes   no alcohol  OB History    Grav Para Term Preterm Abortions TAB SAB Ect Mult Living                  Review of Systems  Unable to perform ROS Constitutional: Positive for fatigue.  HENT: Negative.   Respiratory: Negative.   Cardiovascular: Positive for palpitations.  Gastrointestinal: Negative.   Musculoskeletal: Negative.   Skin: Negative.   Neurological: Negative.   Hematological: Negative.   Psychiatric/Behavioral: Negative.     Allergies  Contrast media  Home Medications  No current outpatient prescriptions on file.  BP 122/83  Pulse 145  Temp(Src) 98.2 F (36.8 C) (Oral)  Resp 31  SpO2 100%  LMP 09/24/2011  Physical Exam  Nursing note and vitals  reviewed. Constitutional: She appears well-developed and well-nourished.  HENT:  Head: Normocephalic and atraumatic.  Eyes: Conjunctivae are normal. Pupils are equal, round, and reactive to light.  Neck: Neck supple. No tracheal deviation present. No thyromegaly present.  Cardiovascular: Regular rhythm.   No murmur heard.      Tachycardic  Pulmonary/Chest: Effort normal and breath sounds normal.  Abdominal: Soft. Bowel sounds are normal. She exhibits no distension. There is no tenderness.  Musculoskeletal: Normal range of motion. She exhibits no edema and no tenderness.  Neurological: She is alert. Coordination normal.  Skin: Skin is warm and dry. No rash noted.  Psychiatric: She has a normal mood and affect.    ED Course  Procedures (including critical care time)   Labs Reviewed  TSH  CBC  D-DIMER, QUANTITATIVE  URINE RAPID DRUG SCREEN (HOSP PERFORMED)  URINALYSIS, ROUTINE W REFLEX MICROSCOPIC   No results found.   Date: 09/26/2011  Rate: 1  Rhythm: sinus tachycardia  QRS Axis: normal  Intervals: normal  ST/T Wave abnormalities: normal  Conduction Disutrbances:none  Narrative Interpretation:   Old EKG Reviewed: none available and changes noted Tracing from 11/13/2010 normal sinus rhythm 85 beats per minute, within normal limits No diagnosis found.    MDM  Patient placed in CDU pending CT Angiocath chest to rule out pulmonary embolism in light of tachycardia. TSH pending Patient likely has component of anxiety, as I notice that when we speak for medical condition her tachycardia increases,  and when more calm her heart rate slows Diagnosis palpitations with sinus tachycardia        Doug Sou, MD 09/27/11 [phone]

## 2011-09-26 NOTE — ED Notes (Signed)
Pt states previous reaction to ct dye. Reported to Dr Dione Booze.

## 2011-09-27 NOTE — ED Provider Notes (Signed)
Medical screening examination/treatment/procedure(s) were conducted as a shared visit with non-physician practitioner(s) and myself.  I personally evaluated the patient during the encounter  Doug Sou, MD 09/27/11 [phone]

## 2011-10-06 ENCOUNTER — Ambulatory Visit (INDEPENDENT_AMBULATORY_CARE_PROVIDER_SITE_OTHER): Payer: BC Managed Care – PPO | Admitting: Family Medicine

## 2011-10-06 VITALS — BP 128/88 | HR 102 | Temp 98.3°F | Resp 16 | Ht 63.0 in | Wt 167.0 lb

## 2011-10-06 DIAGNOSIS — E162 Hypoglycemia, unspecified: Secondary | ICD-10-CM

## 2011-10-06 LAB — POCT GLYCOSYLATED HEMOGLOBIN (HGB A1C): Hemoglobin A1C: 5.5

## 2011-10-06 LAB — GLUCOSE, POCT (MANUAL RESULT ENTRY): POC Glucose: 66

## 2011-10-06 NOTE — Progress Notes (Signed)
  Subjective:    Patient ID: Sonya Harrison, female    DOB: 1980-12-14, 31 y.o.   MRN: [id number]  HPI  Patient complains of tachycardia after prolonged fasting.  First episode occurred at work 09/26/11 and patient was subsequently transported by EMS to [facility].  Extensive work up was done there to include a  CT angiogram, blood work and [employer] which were normal. Tachycardia resolved and patient was discharged home.    Tachycardia returned yesterday after fasting until 1PM.  After eating a large meal patient states she became tachycardic again.  Tachycardia resolved after several minutes.  Preceding both episodes patient felt very shakey. After both episodes the shaky episodes resolved after eating.    Patient also relates to considerable situational stressors.  We have discussed this at previous visits however patient has been reluctant to start medication or enter into marital counseling.(See PH9 questionnaire)  Review of Systems  Respiratory: Negative for chest tightness and shortness of breath.   Cardiovascular: Negative for chest pain.  Neurological: Negative for dizziness and syncope.       Objective:   Physical Exam  Constitutional: She appears well-developed and well-nourished.  HENT:  Head: Normocephalic and atraumatic.  Neck: Normal range of motion. Neck supple. Thyromegaly: thyroid fullness.  Cardiovascular: Normal rate, regular rhythm and normal heart sounds.   Pulmonary/Chest: Effort normal and breath sounds normal.  Neurological: She is alert.  Skin: Skin is warm.  Psychiatric: She has a normal mood and affect.      Results for orders placed in visit on 10/06/11  GLUCOSE, POCT (MANUAL RESULT ENTRY)      Component Value Range   POC Glucose 66    POCT GLYCOSYLATED HEMOGLOBIN (HGB A1C)      Component Value Range   Hemoglobin A1C 5.5          Assessment & Plan:  Hypoglycemia after prolong fasting Depression/Anxiety; marital stressors  Nutritional  guidance Patient declines SSRI or referral for counseling.   She will follow up with me in 1 month.

## 2011-12-13 DIAGNOSIS — Z0271 Encounter for disability determination: Secondary | ICD-10-CM

## 2011-12-29 ENCOUNTER — Ambulatory Visit: Payer: BC Managed Care – PPO

## 2012-01-01 ENCOUNTER — Ambulatory Visit (INDEPENDENT_AMBULATORY_CARE_PROVIDER_SITE_OTHER): Payer: BC Managed Care – PPO | Admitting: Family Medicine

## 2012-01-01 VITALS — BP 135/87 | HR 103 | Temp 98.0°F | Resp 16 | Ht 62.5 in | Wt 171.8 lb

## 2012-01-01 DIAGNOSIS — F40298 Other specified phobia: Secondary | ICD-10-CM

## 2012-01-01 DIAGNOSIS — N898 Other specified noninflammatory disorders of vagina: Secondary | ICD-10-CM

## 2012-01-01 DIAGNOSIS — B360 Pityriasis versicolor: Secondary | ICD-10-CM

## 2012-01-01 DIAGNOSIS — F40243 Fear of flying: Secondary | ICD-10-CM

## 2012-01-01 DIAGNOSIS — Z130 Encounter for screening for diseases of the blood and blood-forming organs and certain disorders involving the immune mechanism: Secondary | ICD-10-CM

## 2012-01-01 DIAGNOSIS — Z1329 Encounter for screening for other suspected endocrine disorder: Secondary | ICD-10-CM

## 2012-01-01 DIAGNOSIS — Z789 Other specified health status: Secondary | ICD-10-CM

## 2012-01-01 LAB — POCT WET PREP WITH KOH
KOH Prep POC: NEGATIVE
Trichomonas, UA: NEGATIVE
Yeast Wet Prep HPF POC: NEGATIVE

## 2012-01-01 MED ORDER — ALPRAZOLAM 0.5 MG PO TABS: ORAL_TABLET | ORAL | Status: DC

## 2012-01-01 MED ORDER — CIPROFLOXACIN HCL 500 MG PO TABS: 500.0000 mg | ORAL_TABLET | Freq: Two times a day (BID) | ORAL | Status: AC

## 2012-01-01 MED ORDER — FLUCONAZOLE 150 MG PO TABS: 150.0000 mg | ORAL_TABLET | Freq: Once | ORAL | Status: AC

## 2012-01-01 MED ORDER — KETOCONAZOLE 200 MG PO TABS: ORAL_TABLET | ORAL | Status: DC

## 2012-01-01 MED ORDER — MEFLOQUINE HCL 250 MG PO TABS: 250.0000 mg | ORAL_TABLET | ORAL | Status: DC

## 2012-01-01 NOTE — Progress Notes (Addendum)
Patient Name: Sonya Harrison Date of Birth: November 12, 1980 Medical Record Number: [id number] Gender: female Date of Encounter: 01/01/2012  History of Present Illness:  Sonya Harrison is a 31 y.o. very pleasant female patient who presents with the following:  Here today with several issues to discuss 1. Will be traveling to [place] this weekend and will be there for 10 weeks.  She will be in cities and in villages.  She would like to have malaria prophylaxis 2. She is also concerned about a vaginal yeast infection.  She notes itching and discharge for about a week.  It stings her skin to urinate, but otherwise no urinary symptoms.  She does not feel that she is at risk for any STI and this is like past episodes of yeast vaginitis. 3 .She also would like to have some cipro on hand for traveler's diarrhea on her trip Her LMP was 12/07/11- she states no change of pregnancy.   4. She is also afraid of flying and would like to have a little medication for anxiety.  She rarely flies due to fear.  She has used xanax in the past for a dental procedure and tolerated it well.   5. She has bilateral shoulder pain for several months- left greater than right.  It hurts the most to reach overhead, and she cannot sleep on that side.  Heat helps, she has not tried much else yet.  No particular known injury 6.  She also has ?tinea versicolor- she has had this in the past and it has gotten a lot better with ketoconazole.  She would like another ketoconazole rx if possible  There is no problem list on file for this patient.  Past Medical History  Diagnosis Date  . Hypertension   . Anemia   . Palpitation     1 time during pregnancy   Past Surgical History  Procedure Date  . Cesarean section 03/07, 04/09, 12/11  . Appendectomy 04/08/2011   History  Substance Use Topics  . Smoking status: Never Smoker   . Smokeless tobacco: Not on file  . Alcohol Use: Yes   No family history on file. Allergies    Allergen Reactions  . Contrast Media (Iodinated Diagnostic Agents) Hives    Medication list has been reviewed and updated.  Review of Systems: As per HPI- otherwise negative.  Physical Examination: Filed Vitals:   01/01/12 1655  BP: 135/87  Pulse: 103  Temp: 98 F (36.7 C)  TempSrc: Oral  Resp: 16  Height: 5' 2.5" (1.588 m)  Weight: 171 lb 12.8 oz (77.928 kg)    Body mass index is 30.92 kg/(m^2).  GEN: WDWN, NAD, Non-toxic, A & O x 3, overweight HEENT: Atraumatic, Normocephalic. Neck supple. No masses, No LAD.  Tm and oropharynx wnl.  Ears and Nose: No external deformity. CV: RRR, No M/G/R. No JVD. No thrill. No extra heart sounds. PULM: CTA B, no wheezes, crackles, rhonchi. No retractions. No resp. distress. No accessory muscle use. EXTR: No c/c/e NEURO Normal gait.  PSYCH: Normally interactive. Conversant. Not depressed or anxious appearing.  Calm demeanor.  Typical tinea versicolor on her back and chest.   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  She is not tender with abduction or internal/ external rotation  Did a self- collect [facility]  Results for orders placed in visit on 01/01/12  POCT WET PREP WITH KOH  Component Value Range   Trichomonas, UA Negative     Clue Cells Wet Prep HPF POC 2-4     Epithelial Wet Prep HPF POC 0-2     Yeast Wet Prep HPF POC neg     Bacteria Wet Prep HPF POC 2     RBC Wet Prep HPF POC neg     WBC Wet Prep HPF POC 1-3     KOH Prep POC Negative      Assessment and Plan: 1. Itching in the vaginal area  POCT Wet Prep with KOH, fluconazole (DIFLUCAN) 150 MG tablet  2. Travel  ciprofloxacin (CIPRO) 500 MG tablet, mefloquine (LARIAM) 250 MG tablet  3. Tinea versicolor  ketoconazole (NIZORAL) 200 MG tablet  4. Fear of flying  ALPRAZolam (XANAX) 0.5 MG tablet   As Sonya Harrison is already going to be taking ketoconazole, I encouraged her to use a topical  anti-fungal and gave an rx for terazole 7- I was not able to prescribe this on Epic as the system would not recognize the order.  I did give her an Rx for diflucan to fall back on, but she is not to use this at the same time as the ketoconazole.  Also consider BV, but her clue cells are not overwhelming and her symptoms are more typical of yeast, so will try a topical anti- fungal first  Travel: encouraged her to have a menactra shot but she refused.  Her tetanus and hepatitis shots are UTD.  Gave Rx for Lariam for her to take for malaria prophylaxis- one weekly as directed.  Also found from the CDC travel site that she needs to have a yellow fever vaccine- gave resources (HD, [facility] occ health) that can help her with this.    Fear of flying: gave small supply of xanax to use as needed Caution re sedation  Explained how to use ketoconazole for tinea versicolor.    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  She does not have time to start PT now but may do so when she returns if her symptoms continue  addnd 01/02/12: Sonya Harrison has decided that she does not want to use mefloquine and that she would prefer to use malarone for her malaria prophylaxis. Also, we cannot combine mefloquine and ketoconazole, so this is another reason to change her regimen.  [facility] and did not [facility].    [facility] and asked them to please rx malarone 250/100, 1 pill daily starting a day prior to travel and continuing for 7 days upon return.  Can also give her the ketoconazole as she is not taking mefloquine.

## 2012-01-02 MED ORDER — ATOVAQUONE-PROGUANIL HCL 250-100 MG PO TABS: 1.0000 | ORAL_TABLET | Freq: Every day | ORAL | Status: DC

## 2012-01-02 NOTE — Progress Notes (Signed)
Addended by: Abbe Amsterdam C on: 01/02/2012 09:09 AM   Modules accepted: Orders

## 2012-02-13 ENCOUNTER — Ambulatory Visit (INDEPENDENT_AMBULATORY_CARE_PROVIDER_SITE_OTHER): Payer: BC Managed Care – PPO | Admitting: Emergency Medicine

## 2012-02-13 VITALS — BP 112/72 | HR 110 | Temp 99.2°F | Resp 16 | Ht 62.0 in | Wt 172.0 lb

## 2012-02-13 DIAGNOSIS — R109 Unspecified abdominal pain: Secondary | ICD-10-CM

## 2012-02-13 DIAGNOSIS — R300 Dysuria: Secondary | ICD-10-CM

## 2012-02-13 DIAGNOSIS — Z331 Pregnant state, incidental: Secondary | ICD-10-CM

## 2012-02-13 DIAGNOSIS — N898 Other specified noninflammatory disorders of vagina: Secondary | ICD-10-CM

## 2012-02-13 DIAGNOSIS — N3090 Cystitis, unspecified without hematuria: Secondary | ICD-10-CM

## 2012-02-13 LAB — POCT URINALYSIS DIPSTICK
Bilirubin, UA: NEGATIVE
Glucose, UA: NEGATIVE
Nitrite, UA: NEGATIVE

## 2012-02-13 LAB — POCT UA - MICROSCOPIC ONLY: Crystals, Ur, HPF, POC: NEGATIVE

## 2012-02-13 LAB — POCT WET PREP WITH KOH
Bacteria Wet Prep HPF POC: NEGATIVE
Clue Cells Wet Prep HPF POC: NEGATIVE
Trichomonas, UA: NEGATIVE
Yeast Wet Prep HPF POC: NEGATIVE

## 2012-02-13 LAB — POCT URINE PREGNANCY: Preg Test, Ur: POSITIVE

## 2012-02-13 MED ORDER — PRENATAL MULTIVIT-MIN-FE-FA 0.1 MG PO CAPS: 1.0000 | ORAL_CAPSULE | Freq: Every day | ORAL | Status: DC

## 2012-02-13 MED ORDER — NITROFURANTOIN MONOHYD MACRO 100 MG PO CAPS: 100.0000 mg | ORAL_CAPSULE | Freq: Two times a day (BID) | ORAL | Status: AC

## 2012-02-13 NOTE — Progress Notes (Deleted)
  Subjective:    Patient ID: Sonya Harrison, female    DOB: 03-Dec-1980, 31 y.o.   MRN: [id number]  HPI    Review of Systems     Objective:   Physical Exam        Assessment & Plan:

## 2012-02-13 NOTE — Progress Notes (Signed)
   Patient Name: Sonya Harrison Date of Birth: 1980-10-15 Medical Record Number: [id number] Gender: female Date of Encounter: 02/13/2012  Chief Complaint: Abdominal Cramping and Dysuria   History of Present Illness:  Sonya Harrison is a 31 y.o. very pleasant female patient who presents with the following:  Sudden onset dysuria and frequency and cloudy foul smelling urine.  Urgency  Missed last menses.  No fever or chills.  No stool change.    There is no problem list on file for this patient.  Past Medical History  Diagnosis Date  . Hypertension   . Anemia   . Palpitation     1 time during pregnancy   Past Surgical History  Procedure Date  . Cesarean section 03/07, 04/09, 12/11  . Appendectomy 04/08/2011   History  Substance Use Topics  . Smoking status: Never Smoker   . Smokeless tobacco: Not on file  . Alcohol Use: Yes   No family history on file. Allergies  Allergen Reactions  . Contrast Media (Iodinated Diagnostic Agents) Hives    Medication list has been reviewed and updated.  Current Outpatient Prescriptions on File Prior to Visit  Medication Sig Dispense Refill  . ALPRAZolam (XANAX) 0.5 MG tablet Take 1/2 or 1 every 8 hours as needed for anxiety  20 tablet  0  . atovaquone-proguanil (MALARONE) 250-100 MG TABS Take 1 tablet by mouth daily. Start one or two days prior to travel, and continue for 7 days once home  50 tablet  0  . ketoconazole (NIZORAL) 200 MG tablet Take two tablets once and then exercise and sweat- shower after 8 hours.  4 tablet  0    Review of Systems:  As per HPI, otherwise negative.    Physical Examination: Filed Vitals:   02/13/12 1903  BP: 112/72  Pulse: 110  Temp: 99.2 F (37.3 C)  Resp: 16   Filed Vitals:   02/13/12 1903  Height: 5\' 2"  (1.575 m)  Weight: 172 lb (78.019 kg)   Body mass index is 31.46 kg/(m^2). Ideal Body Weight: Weight in (lb) to have BMI = 25: 136.4   GEN: WDWN, NAD, Non-toxic, A & O x 3 HEENT:  Atraumatic, Normocephalic. Neck supple. No masses, No LAD. Ears and Nose: No external deformity. CV: RRR, No M/G/R. No JVD. No thrill. No extra heart sounds. PULM: CTA B, no wheezes, crackles, rhonchi. No retractions. No resp. distress. No accessory muscle use. ABD: S, NT, ND, +BS. No rebound. No HSM. EXTR: No c/c/e NEURO Normal gait.  PSYCH: Normally interactive. Conversant. Not depressed or anxious appearing.  Calm demeanor.    EKG / Labs / Xrays: None available at time of encounter  Assessment and Plan: UTI Pregnancy RX macrobid Follow up with GYN   Carmelina Dane, MD

## 2012-02-13 NOTE — Patient Instructions (Addendum)
Pregnancy  If you are planning on getting pregnant, it is a good idea to make a preconception appointment with your care- giver to discuss having a healthy lifestyle before getting pregnant. Such as, diet, weight, exercise, taking prenatal vitamins especially folic acid (it helps prevent brain and spinal cord defects), avoiding alcohol, smoking and illegal drugs, medical problems (diabetes, convulsions), family history of genetic problems, working conditions and immunizations. It is better to have knowledge of these things and do something about them before getting pregnant.  In your pregnancy, it is important to follow certain guidelines to have a healthy baby. It is very important to get good prenatal care and follow your caregiver's instructions. Prenatal care includes all the medical care you receive before your baby's birth. This helps to prevent problems during the pregnancy and childbirth.  HOME CARE INSTRUCTIONS    Start your prenatal visits by the 12th week of pregnancy or before when possible. They are usually scheduled monthly at first. They are more often in the last 2 months before delivery. It is important that you keep your caregiver's appointments and follow your caregiver's instructions regarding medication use, exercise, and diet.   During pregnancy, you are providing food for you and your baby. Eat a regular, well-balanced diet. Choose foods such as meat, fish, milk and other dairy products, vegetables, fruits, whole-grain breads and cereals. Your caregiver will inform you of the ideal weight gain depending on your current height and weight. Drink lots of liquids. Try to drink 8 glasses of water a day.   Alcohol is associated with a number of birth defects including fetal alcohol syndrome. It is best to avoid alcohol completely. Smoking will cause low birth rate and prematurity. Use of alcohol and nicotine during your pregnancy also increases the chances that your child will be chemically  dependent later in their life and may contribute to SIDS (Sudden Infant Death Syndrome).   Do not use illegal drugs.   Only take prescription or over-the-counter medications that are recommended by your caregiver. Other medications can cause genetic and physical problems in the baby.   Morning sickness can often be helped by keeping soda crackers at the bedside. Eat a couple before arising in the morning.   A sexual relationship may be continued until near the end of pregnancy if there are no other problems such as early (premature) leaking of amniotic fluid from the membranes, vaginal bleeding, painful intercourse or belly (abdominal) pain.   Exercise regularly. Check with your caregiver if you are unsure of the safety of some of your exercises.   Do not use hot tubs, steam rooms or saunas. These increase the risk of fainting or passing out and hurting yourself and the baby. Swimming is OK for exercise. Get plenty of rest, including afternoon naps when possible especially in the third trimester.   Avoid toxic odors and chemicals.   Do not wear high heels. They may cause you to lose your balance and fall.   Do not lift over 5 pounds. If you do lift anything, lift with your legs and thighs, not your back.   Avoid long trips, especially in the third trimester.   If you have to travel out of the city or state, take a copy of your medical records with you.  SEEK IMMEDIATE MEDICAL CARE IF:    You develop an unexplained oral temperature above 102 F (38.9 C), or as your caregiver suggests.   You have leaking of fluid from the vagina. If   your temperature and inform your caregiver of this when you call.   There is vaginal spotting or bleeding. Notify your caregiver of the amount and how many pads are used.   You continue to feel sick to your stomach (nauseous) and have no relief from remedies suggested, or you throw up (vomit) blood or coffee ground like  materials.   You develop upper abdominal pain.   You have round ligament discomfort in the lower abdominal area. This still must be evaluated by your caregiver.   You feel contractions of the uterus.   You do not feel the baby move, or there is less movement than before.   You have painful urination.   You have abnormal vaginal discharge.   You have persistent diarrhea.   You get a severe headache.   You have problems with your vision.   You develop muscle weakness.   You feel dizzy and faint.   You develop shortness of breath.   You develop chest pain.   You have back pain that travels down to your leg and feet.   You feel irregular or a very fast heartbeat.   You develop excessive weight gain in a short period of time (5 pounds in 3 to 5 days).   You are involved with a domestic violence situation.  Document Released: 07/31/2005 Document Revised: 07/20/2011 Document Reviewed: 01/22/2009 [facility] Patient Information 2012 [place], [place].Urinary Tract Infection Infections of the urinary tract can start in several places. A bladder infection (cystitis), a kidney infection (pyelonephritis), and a prostate infection (prostatitis) are different types of urinary tract infections (UTIs). They usually get better if treated with medicines (antibiotics) that kill germs. Take all the medicine until it is gone. You or your child may feel better in a few days, but TAKE ALL MEDICINE or the infection may not respond and may become more difficult to treat. HOME CARE INSTRUCTIONS   Drink enough water and fluids to keep the urine clear or pale yellow. Cranberry juice is especially recommended, in addition to large amounts of water.   Avoid caffeine, tea, and carbonated beverages. They tend to irritate the bladder.   Alcohol may irritate the prostate.   Only take over-the-counter or prescription medicines for pain, discomfort, or fever as directed by your caregiver.  To prevent further  infections:  Empty the bladder often. Avoid holding urine for long periods of time.   After a bowel movement, women should cleanse from front to back. Use each tissue only once.   Empty the bladder before and after sexual intercourse.  FINDING OUT THE RESULTS OF YOUR TEST Not all test results are available during your visit. If your or your child's test results are not back during the visit, make an appointment with your caregiver to find out the results. Do not assume everything is normal if you have not heard from your caregiver or the medical facility. It is important for you to follow up on all test results. SEEK MEDICAL CARE IF:   There is back pain.   Your baby is older than 3 months with a rectal temperature of 100.5 F (38.1 C) or higher for more than 1 day.   Your or your child's problems (symptoms) are no better in 3 days. Return sooner if you or your child is getting worse.  SEEK IMMEDIATE MEDICAL CARE IF:   There is severe back pain or lower abdominal pain.   You or your child develops chills.   You have a fever.  Your baby is older than 3 months with a rectal temperature of 102 F (38.9 C) or higher.   Your baby is 80 months old or younger with a rectal temperature of 100.4 F (38 C) or higher.   There is nausea or vomiting.   There is continued burning or discomfort with urination.  MAKE SURE YOU:   Understand these instructions.   Will watch your condition.   Will get help right away if you are not doing well or get worse.  Document Released: 05/10/2005 Document Revised: 07/20/2011 Document Reviewed: 12/13/2006 [facility] Patient Information 2012 [place], [place].

## 2012-03-07 LAB — OB RESULTS CONSOLE HIV ANTIBODY (ROUTINE TESTING): HIV: NONREACTIVE

## 2012-04-02 ENCOUNTER — Encounter (HOSPITAL_COMMUNITY): Payer: Self-pay | Admitting: *Deleted

## 2012-04-08 ENCOUNTER — Encounter (HOSPITAL_COMMUNITY): Payer: Self-pay | Admitting: Pharmacist

## 2012-04-09 ENCOUNTER — Encounter (HOSPITAL_COMMUNITY): Payer: Self-pay | Admitting: *Deleted

## 2012-04-09 ENCOUNTER — Emergency Department (HOSPITAL_COMMUNITY)
Admission: EM | Admit: 2012-04-09 | Discharge: 2012-04-10 | Disposition: A | Discharge status: Home / Self Care | Payer: BC Managed Care – PPO | Attending: Emergency Medicine | Admitting: Emergency Medicine

## 2012-04-09 DIAGNOSIS — M259 Joint disorder, unspecified: Secondary | ICD-10-CM | POA: Insufficient documentation

## 2012-04-09 DIAGNOSIS — O9989 Other specified diseases and conditions complicating pregnancy, childbirth and the puerperium: Secondary | ICD-10-CM | POA: Insufficient documentation

## 2012-04-09 DIAGNOSIS — M542 Cervicalgia: Secondary | ICD-10-CM | POA: Insufficient documentation

## 2012-04-09 DIAGNOSIS — O99891 Other specified diseases and conditions complicating pregnancy: Secondary | ICD-10-CM | POA: Insufficient documentation

## 2012-04-09 DIAGNOSIS — Z043 Encounter for examination and observation following other accident: Secondary | ICD-10-CM | POA: Insufficient documentation

## 2012-04-09 DIAGNOSIS — O99019 Anemia complicating pregnancy, unspecified trimester: Secondary | ICD-10-CM | POA: Insufficient documentation

## 2012-04-09 DIAGNOSIS — M899 Disorder of bone, unspecified: Secondary | ICD-10-CM | POA: Insufficient documentation

## 2012-04-09 DIAGNOSIS — K219 Gastro-esophageal reflux disease without esophagitis: Secondary | ICD-10-CM | POA: Insufficient documentation

## 2012-04-09 DIAGNOSIS — D649 Anemia, unspecified: Secondary | ICD-10-CM | POA: Insufficient documentation

## 2012-04-09 MED ORDER — SODIUM CHLORIDE 0.9 % IV SOLN
INTRAVENOUS | Status: DC
  Administered 2012-04-10: via INTRAVENOUS

## 2012-04-09 MED ORDER — ACETAMINOPHEN 325 MG PO TABS
650.0000 mg | ORAL_TABLET | Freq: Once | ORAL | Status: AC
  Administered 2012-04-10: 650 mg via ORAL
  Filled 2012-04-09: qty 2
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
## 2012-04-09 NOTE — H&P (Signed)
Sonya Harrison  DICTATION # [id number] CSN# [id number]   [name], MD 04/09/2012 9:41 AM

## 2012-04-10 ENCOUNTER — Emergency Department (HOSPITAL_COMMUNITY): Payer: BC Managed Care – PPO

## 2012-04-10 LAB — CBC
HCT: 34.1 % — ABNORMAL LOW (ref 36.0–46.0)
Hemoglobin: 11.9 g/dL — ABNORMAL LOW (ref 12.0–15.0)
MCH: 26.9 pg (ref 26.0–34.0)
MCHC: 34.9 g/dL (ref 30.0–36.0)
MCV: 77 fL — ABNORMAL LOW (ref 78.0–100.0)
RDW: 15.2 % (ref 11.5–15.5)

## 2012-04-10 LAB — POCT I-STAT, CHEM 8
Calcium, Ion: 1.22 mmol/L (ref 1.12–1.23)
Creatinine, Ser: 0.7 mg/dL (ref 0.50–1.10)
Glucose, Bld: 97 mg/dL (ref 70–99)
HCT: 36 % (ref 36.0–46.0)
Hemoglobin: 12.2 g/dL (ref 12.0–15.0)

## 2012-04-10 MED ORDER — PROMETHAZINE HCL 25 MG PO TABS: 25.0000 mg | ORAL_TABLET | Freq: Four times a day (QID) | ORAL | Status: DC | PRN

## 2012-04-10 NOTE — ED Notes (Signed)
FHTs auscultated midline at mons pubis 7 finger breadths below umbilicus - 156 beats per min - strong - regular

## 2012-04-10 NOTE — H&P (Signed)
Sonya Harrison, Sonya Harrison NO.:  [id number]  MEDICAL RECORD NO.:  [id number]  LOCATION:  [facility]                        FACILITY:  [facility]  PHYSICIAN:  Duke Salvia. [name], M.D.DATE OF BIRTH:  May 09, 1981  DATE OF ADMISSION:  04/09/2012 DATE OF DISCHARGE:                             HISTORY & PHYSICAL   CHIEF COMPLAINT:  Incompetent cervix.  HISTORY OF PRESENT ILLNESS:  This is a 31 year old G6, P2-1-2-3, EDD on October 12, 2012.  The patient has a history of 3 prior cesarean sections. The last went to 39 weeks with McDonald cerclage.  First delivery in 2007; 8 pounds 0 ounces at 38 and half weeks cesarean section, and she had chorioamnionitis.  Later that year 11/07 had early IUFD after rupture of membranes with a question of an incompetent cervix.  In 2009 pregnancy delivered by repeat cesarean section at 30 weeks, 3 pounds 4 ounces.  She did have a cerclage with that pregnancy and had a marginal abruption.  On 12/11; 9 pounds 10 ounces at 39 weeks.  Repeat cesarean section with McDonald's cerclage.  They received weekly progesterone without pregnancy.  She presents now from abdominal cerclage.  She has been on oral Prometrium for the 1st trimester.  We will switched to weekly IM progesterone after 16 weeks.  This procedure including risks related to bleeding infection, rupture of membranes, pregnancy loss, and all reviewed with her which she understands and accepts.  ALLERGIES:  None.  OBSTETRICAL HISTORY:  As noted above.  Please see her [place] form for details of her past medical history.  PHYSICAL EXAMINATION:  VITAL SIGNS:  Temp 98.2, blood pressure 118/80. HEENT:  Unremarkable. NECK:  Supple out masses. LUNGS:  Clear. CARDIOVASCULAR:  Regular rate and rhythm without murmurs, rubs, gallops. BREASTS:  Without masses. ABDOMEN:  Soft, flat, nontender.  Fetal heart rate 148. CERVIX:  Closed. EXTREMITIES:  Unremarkable. NEUROLOGIC:   Unremarkable.  IMPRESSION: 1. A 14-week intrauterine pregnancy, previous cesarean section x3. 2. Incompetent cervix.  PLAN:  McDonald cerclage.  Procedure and risks discussed as above.     [name] [name], M.D.     [facility]MED[facility]  D:  04/09/2012  T:  04/10/2012  Job:  [id number]
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
## 2012-04-10 NOTE — ED Notes (Addendum)
c spine films neg per rad read; EDP aware; EMS cervical collar removed per V.O. EDP; HOB elevated 90 degrees; pt states feeling "some better" after tylenol administration

## 2012-04-10 NOTE — ED Notes (Signed)
Spoke with pt's husband, Vikki Ports, via telephone, per pt's request; updated on status

## 2012-04-10 NOTE — ED Notes (Signed)
Resting quietly on stretcher with friend at bedside; no complaints at this time - plan is to go to [facility] for observation overnight - pt aware of same; [facility] RN unable to accept pt at this time - will call me back

## 2012-04-11 MED ORDER — METHYLENE BLUE 1 % INJ SOLN
INTRAMUSCULAR | Status: AC
  Filled 2012-04-11: qty 1

## 2012-04-13 ENCOUNTER — Ambulatory Visit (INDEPENDENT_AMBULATORY_CARE_PROVIDER_SITE_OTHER): Payer: BC Managed Care – PPO | Admitting: Internal Medicine

## 2012-04-13 VITALS — BP 113/74 | HR 105 | Temp 98.7°F | Resp 16 | Ht 62.75 in | Wt 172.8 lb

## 2012-04-13 DIAGNOSIS — S139XXA Sprain of joints and ligaments of unspecified parts of neck, initial encounter: Secondary | ICD-10-CM

## 2012-04-13 DIAGNOSIS — M542 Cervicalgia: Secondary | ICD-10-CM

## 2012-04-13 DIAGNOSIS — S161XXA Strain of muscle, fascia and tendon at neck level, initial encounter: Secondary | ICD-10-CM

## 2012-04-13 MED ORDER — PREDNISONE 10 MG PO TABS: ORAL_TABLET | ORAL | Status: DC

## 2012-04-13 NOTE — Patient Instructions (Addendum)
Apply heat to your neck for 30 minutes as often as you like for for muscle relaxation Apply ice to her neck for 20 minutes for pain relief as needed Gentle range of motion the neck is okay Wear the cervical collar until you improve and then began to wean the collar by being out of it for an hour at a time We will set up a physical therapy appointment Remain out of work until the collar offers you relief of pain
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
## 2012-04-19 ENCOUNTER — Ambulatory Visit (HOSPITAL_COMMUNITY): Payer: BC Managed Care – PPO

## 2012-04-19 ENCOUNTER — Ambulatory Visit (HOSPITAL_COMMUNITY)
Admission: RE | Admit: 2012-04-19 | Discharge: 2012-04-19 | Disposition: A | Discharge status: Home / Self Care | Payer: BC Managed Care – PPO | Source: Ambulatory Visit | Attending: Obstetrics and Gynecology | Admitting: Obstetrics and Gynecology

## 2012-04-19 ENCOUNTER — Encounter (HOSPITAL_COMMUNITY): Payer: Self-pay | Admitting: *Deleted

## 2012-04-19 ENCOUNTER — Encounter (HOSPITAL_COMMUNITY): Payer: Self-pay

## 2012-04-19 ENCOUNTER — Encounter (HOSPITAL_COMMUNITY)
Admission: RE | Disposition: A | Discharge status: Home / Self Care | Payer: Self-pay | Source: Ambulatory Visit | Attending: Obstetrics and Gynecology

## 2012-04-19 DIAGNOSIS — O3430 Maternal care for cervical incompetence, unspecified trimester: Secondary | ICD-10-CM | POA: Insufficient documentation

## 2012-04-19 HISTORY — DX: Incompetence of cervix uteri: N88.3

## 2012-04-19 HISTORY — PX: CERVICAL CERCLAGE: SHX1329

## 2012-04-19 HISTORY — DX: Gastro-esophageal reflux disease without esophagitis: K21.9

## 2012-04-19 SURGERY — CERCLAGE, CERVIX, VAGINAL APPROACH
Anesthesia: Spinal | Site: Vagina | Wound class: Clean Contaminated

## 2012-04-19 MED ORDER — FENTANYL CITRATE 0.05 MG/ML IJ SOLN
INTRAMUSCULAR | Status: DC | PRN
  Administered 2012-04-19 (×2): 50 ug via INTRAVENOUS

## 2012-04-19 MED ORDER — FENTANYL CITRATE 0.05 MG/ML IJ SOLN: 25.0000 ug | INTRAMUSCULAR | Status: DC | PRN

## 2012-04-19 MED ORDER — ONDANSETRON HCL 4 MG/2ML IJ SOLN
INTRAMUSCULAR | Status: DC | PRN
  Administered 2012-04-19: 4 mg via INTRAVENOUS

## 2012-04-19 MED ORDER — LACTATED RINGERS IV SOLN
INTRAVENOUS | Status: DC
  Administered 2012-04-19: 08:00:00 via INTRAVENOUS
  Administered 2012-04-19: 125 mL/h via INTRAVENOUS

## 2012-04-19 MED ORDER — ONDANSETRON HCL 4 MG/2ML IJ SOLN
INTRAMUSCULAR | Status: AC
  Filled 2012-04-19: qty 2

## 2012-04-19 MED ORDER — CEFAZOLIN SODIUM-DEXTROSE 2-3 GM-% IV SOLR
2.0000 g | INTRAVENOUS | Status: AC
  Administered 2012-04-19: 2 g via INTRAVENOUS

## 2012-04-19 MED ORDER — CEFAZOLIN SODIUM-DEXTROSE 2-3 GM-% IV SOLR
INTRAVENOUS | Status: AC
  Filled 2012-04-19: qty 50

## 2012-04-19 MED ORDER — PROMETHAZINE HCL 25 MG PO TABS: 25.0000 mg | ORAL_TABLET | Freq: Four times a day (QID) | ORAL | Status: DC | PRN

## 2012-04-19 MED ORDER — FENTANYL CITRATE 0.05 MG/ML IJ SOLN
INTRAMUSCULAR | Status: AC
  Filled 2012-04-19: qty 2

## 2012-04-19 MED ORDER — EPHEDRINE SULFATE 50 MG/ML IJ SOLN
INTRAMUSCULAR | Status: DC | PRN
  Administered 2012-04-19 (×2): 5 mg via INTRAVENOUS

## 2012-04-19 MED ORDER — EPHEDRINE 5 MG/ML INJ
INTRAVENOUS | Status: AC
  Filled 2012-04-19: qty 10

## 2012-04-19 SURGICAL SUPPLY — 17 items
CATH ROBINSON RED A/P 16FR (CATHETERS) IMPLANT
CLOTH BEACON ORANGE TIMEOUT ST (SAFETY) ×2 IMPLANT
COUNTER NEEDLE 1200 MAGNETIC (NEEDLE) ×2 IMPLANT
GLOVE BIO SURGEON STRL SZ7 (GLOVE) ×4 IMPLANT
GOWN PREVENTION PLUS LG XLONG (DISPOSABLE) ×2 IMPLANT
GOWN STRL REIN XL XLG (GOWN DISPOSABLE) ×2 IMPLANT
NEEDLE SPNL 22GX3.5 QUINCKE BK (NEEDLE) IMPLANT
NS IRRIG 1000ML POUR BTL (IV SOLUTION) ×2 IMPLANT
PACK VAGINAL MINOR WOMEN LF (CUSTOM PROCEDURE TRAY) ×2 IMPLANT
PAD OB MATERNITY 4.3X12.25 (PERSONAL CARE ITEMS) ×2 IMPLANT
PAD PREP 24X48 CUFFED NSTRL (MISCELLANEOUS) ×2 IMPLANT
SUT TEVDEK  2 DEKNATEL (SUTURE) ×2 IMPLANT
SYR CONTROL 10ML LL (SYRINGE) IMPLANT
TOWEL OR 17X24 6PK STRL BLUE (TOWEL DISPOSABLE) ×4 IMPLANT
TUBING NON-CON 1/4 X 20 CONN (TUBING) IMPLANT
WATER STERILE IRR 1000ML POUR (IV SOLUTION) ×2 IMPLANT
YANKAUER SUCT BULB TIP NO VENT (SUCTIONS) IMPLANT

## 2012-04-19 NOTE — Anesthesia Procedure Notes (Signed)
Spinal  Patient location during procedure: OR Start time: 04/19/2012 7:30 AM End time: 04/19/2012 7:33 AM Staffing Anesthesiologist: Sandrea Hughs Performed by: anesthesiologist  Preanesthetic Checklist Completed: patient identified, site marked, surgical consent, pre-op evaluation, timeout performed, IV checked, risks and benefits discussed and monitors and equipment checked Spinal Block Patient position: sitting Prep: DuraPrep Patient monitoring: heart rate, cardiac monitor, continuous pulse ox and blood pressure Approach: midline Location: L3-4 Injection technique: single-shot Needle Needle type: Sprotte  Needle gauge: 24 G Needle length: 9 cm Needle insertion depth: 5 cm Assessment Sensory level: T12

## 2012-04-19 NOTE — Anesthesia Preprocedure Evaluation (Signed)
Anesthesia Evaluation  Patient identified by MRN, date of birth, ID band Patient awake    Reviewed: Allergy & Precautions, H&P , Patient's Chart, lab work & pertinent test results  Airway Mallampati: II TM Distance: >3 FB Neck ROM: full    Dental No notable dental hx.    Pulmonary  breath sounds clear to auscultation  Pulmonary exam normal       Cardiovascular Exercise Tolerance: Good Rhythm:regular Rate:Normal     Neuro/Psych    GI/Hepatic GERD- (+) Nausea tosday;   ,  Endo/Other    Renal/GU      Musculoskeletal   Abdominal   Peds  Hematology   Anesthesia Other Findings   Reproductive/Obstetrics                           Anesthesia Physical Anesthesia Plan  ASA: II  Anesthesia Plan: Spinal   Post-op Pain Management:    Induction:   Airway Management Planned:   Additional Equipment:   Intra-op Plan:   Post-operative Plan:   Informed Consent: I have reviewed the patients History and Physical, chart, labs and discussed the procedure including the risks, benefits and alternatives for the proposed anesthesia with the patient or authorized representative who has indicated his/her understanding and acceptance.   Dental Advisory Given  Plan Discussed with: CRNA  Anesthesia Plan Comments: (Lab work confirmed with CRNA in room. Platelets okay. Discussed spinal anesthetic, and patient consents to the procedure:  included risk of possible headache,backache, failed block, allergic reaction, and nerve injury. This patient was asked if she had any questions or concerns before the procedure started. )        Anesthesia Quick Evaluation

## 2012-04-19 NOTE — Anesthesia Postprocedure Evaluation (Signed)
Anesthesia Post Note  Patient: Sonya Harrison  Procedure(s) Performed: Procedure(s) (LRB): CERCLAGE CERVICAL (N/A)  Anesthesia type: Spinal  Patient location: PACU  Post pain: Pain level controlled  Post assessment: Post-op Vital signs reviewed  Last Vitals:  Filed Vitals:   04/19/12 0845  BP:   Pulse: 82  Temp:   Resp: 15    Post vital signs: Reviewed  Level of consciousness: awake  Complications: No apparent anesthesia complications

## 2012-04-19 NOTE — Op Note (Signed)
Preoperative diagnosis: 14+ week IUP, incompetent cervix  Postoperative diagnosis: Same  Procedure: McDonald cerclage  Surgeon: [name]  Anesthesia: Spinal  Complications: None  Drains: In and out Foley catheter  EBL: Less than 50 cc  Procedure and findings:  The patient taken the operating room after appropriate timeout for taken, an adequate level of spinal anesthesia was obtained. The legs were placed stirrups she was prepped and draped in normal fashion for vaginal procedures the bladder was drained. Prior procedure the FHR was auscultated with Doppler 142. Weighted speculum was positioned the anterior cervix was gently grasped with a ring forcep, the cervical vaginal reflection was identified and #2 Tevdek suture was then used in pursestring fashion starting at 11:00 laterally, and posteriorly around in pursestring fashion. This was tied down at 12:00 with excellent hemostasis at that point. The bladder was re\re drained and noted to be clear she tolerated the procedure went to PACU in good condition.  Dictated with [facility]  Sonya Harrison M. Milana Obey.D.

## 2012-04-19 NOTE — Progress Notes (Signed)
The patient was re-examined with no change in status 

## 2012-04-19 NOTE — Transfer of Care (Signed)
Immediate Anesthesia Transfer of Care Note  Patient: Sonya Harrison  Procedure(s) Performed: Procedure(s) (LRB) with comments: CERCLAGE CERVICAL (N/A)  Patient Location: PACU  Anesthesia Type: Spinal  Level of Consciousness: awake, alert  and oriented  Airway & Oxygen Therapy: Patient Spontanous Breathing  Post-op Assessment: Report given to PACU RN and Post -op Vital signs reviewed and stable  Post vital signs: Reviewed and stable  Complications: No apparent anesthesia complications

## 2012-04-22 ENCOUNTER — Encounter (HOSPITAL_COMMUNITY): Payer: Self-pay | Admitting: Obstetrics and Gynecology

## 2012-04-25 ENCOUNTER — Ambulatory Visit: Payer: No Typology Code available for payment source | Attending: Internal Medicine | Admitting: Rehabilitation

## 2012-04-25 DIAGNOSIS — M2560 Stiffness of unspecified joint, not elsewhere classified: Secondary | ICD-10-CM | POA: Insufficient documentation

## 2012-04-25 DIAGNOSIS — R293 Abnormal posture: Secondary | ICD-10-CM | POA: Insufficient documentation

## 2012-04-25 DIAGNOSIS — IMO0001 Reserved for inherently not codable concepts without codable children: Secondary | ICD-10-CM | POA: Insufficient documentation

## 2012-04-25 DIAGNOSIS — M2550 Pain in unspecified joint: Secondary | ICD-10-CM | POA: Insufficient documentation

## 2012-05-01 ENCOUNTER — Ambulatory Visit: Payer: No Typology Code available for payment source | Admitting: Physical Therapy

## 2012-05-06 ENCOUNTER — Ambulatory Visit: Payer: No Typology Code available for payment source | Admitting: Rehabilitation

## 2012-05-08 ENCOUNTER — Ambulatory Visit: Payer: No Typology Code available for payment source | Admitting: Physical Therapy

## 2012-05-09 ENCOUNTER — Ambulatory Visit (INDEPENDENT_AMBULATORY_CARE_PROVIDER_SITE_OTHER): Payer: Self-pay | Admitting: Family Medicine

## 2012-05-09 VITALS — BP 120/74 | HR 110 | Temp 98.6°F | Resp 16 | Ht 62.0 in | Wt 178.4 lb

## 2012-05-09 DIAGNOSIS — M542 Cervicalgia: Secondary | ICD-10-CM

## 2012-05-09 DIAGNOSIS — M62838 Other muscle spasm: Secondary | ICD-10-CM

## 2012-05-09 DIAGNOSIS — M549 Dorsalgia, unspecified: Secondary | ICD-10-CM
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
## 2012-05-12 ENCOUNTER — Encounter (HOSPITAL_COMMUNITY): Payer: Self-pay

## 2012-05-12 ENCOUNTER — Inpatient Hospital Stay (HOSPITAL_COMMUNITY): Payer: BC Managed Care – PPO

## 2012-05-12 ENCOUNTER — Inpatient Hospital Stay (HOSPITAL_COMMUNITY)
Admission: AD | Admit: 2012-05-12 | Discharge: 2012-05-26 | DRG: 652 | Disposition: A | Discharge status: Home / Self Care | Payer: BC Managed Care – PPO | Source: Ambulatory Visit | Attending: Obstetrics and Gynecology | Admitting: Obstetrics and Gynecology

## 2012-05-12 DIAGNOSIS — O9903 Anemia complicating the puerperium: Secondary | ICD-10-CM | POA: Diagnosis not present

## 2012-05-12 DIAGNOSIS — O34219 Maternal care for unspecified type scar from previous cesarean delivery: Secondary | ICD-10-CM | POA: Diagnosis present

## 2012-05-12 DIAGNOSIS — O3430 Maternal care for cervical incompetence, unspecified trimester: Principal | ICD-10-CM | POA: Diagnosis present

## 2012-05-12 DIAGNOSIS — D62 Acute posthemorrhagic anemia: Secondary | ICD-10-CM | POA: Diagnosis not present

## 2012-05-12 DIAGNOSIS — O411090 Infection of amniotic sac and membranes, unspecified, unspecified trimester, not applicable or unspecified: Secondary | ICD-10-CM | POA: Diagnosis present

## 2012-05-12 LAB — URINALYSIS, ROUTINE W REFLEX MICROSCOPIC
Bilirubin Urine: NEGATIVE
Glucose, UA: NEGATIVE mg/dL
Ketones, ur: NEGATIVE mg/dL
Leukocytes, UA: NEGATIVE
Protein, ur: NEGATIVE mg/dL
pH: 6.5 (ref 5.0–8.0)

## 2012-05-12 LAB — URINE MICROSCOPIC-ADD ON

## 2012-05-12 LAB — CBC
HCT: 33.2 % — ABNORMAL LOW (ref 36.0–46.0)
Hemoglobin: 11.5 g/dL — ABNORMAL LOW (ref 12.0–15.0)
MCHC: 34.6 g/dL (ref 30.0–36.0)
WBC: 13.3 10*3/uL — ABNORMAL HIGH (ref 4.0–10.5)

## 2012-05-12 MED ORDER — ZOLPIDEM TARTRATE 5 MG PO TABS
5.0000 mg | ORAL_TABLET | Freq: Every evening | ORAL | Status: DC | PRN
  Administered 2012-05-21 – 2012-05-22 (×2): 5 mg via ORAL
  Filled 2012-05-12 (×3): qty 1

## 2012-05-12 MED ORDER — CALCIUM CARBONATE ANTACID 500 MG PO CHEW
2.0000 | CHEWABLE_TABLET | ORAL | Status: DC | PRN
  Filled 2012-05-12: qty 2

## 2012-05-12 MED ORDER — OXYCODONE-ACETAMINOPHEN 5-325 MG PO TABS
1.0000 | ORAL_TABLET | ORAL | Status: DC | PRN
  Administered 2012-05-12 – 2012-05-21 (×3): 2 via ORAL
  Filled 2012-05-12 (×3): qty 2

## 2012-05-12 MED ORDER — PROGESTERONE MICRONIZED 200 MG PO CAPS
200.0000 mg | ORAL_CAPSULE | Freq: Every day | ORAL | Status: DC
  Administered 2012-05-12 – 2012-05-21 (×10): 200 mg via VAGINAL
  Filled 2012-05-12 (×12): qty 1

## 2012-05-12 MED ORDER — SODIUM CHLORIDE 0.9 % IV SOLN
2.0000 g | Freq: Once | INTRAVENOUS | Status: AC
  Administered 2012-05-12: 2 g via INTRAVENOUS
  Filled 2012-05-12: qty 2000

## 2012-05-12 MED ORDER — DOCUSATE SODIUM 100 MG PO CAPS
100.0000 mg | ORAL_CAPSULE | Freq: Every day | ORAL | Status: DC
  Administered 2012-05-12 – 2012-05-13 (×2): 100 mg via ORAL
  Filled 2012-05-12 (×4): qty 1

## 2012-05-12 MED ORDER — AMPICILLIN SODIUM 1 G IJ SOLR
1.0000 g | INTRAMUSCULAR | Status: AC
  Administered 2012-05-12 – 2012-05-14 (×12): 1 g via INTRAVENOUS
  Filled 2012-05-12 (×12): qty 1000

## 2012-05-12 MED ORDER — ACETAMINOPHEN 325 MG PO TABS
650.0000 mg | ORAL_TABLET | ORAL | Status: DC | PRN
  Administered 2012-05-21 – 2012-05-23 (×3): 650 mg via ORAL
  Filled 2012-05-12 (×3): qty 2

## 2012-05-12 MED ORDER — PRENATAL MULTIVITAMIN CH
1.0000 | ORAL_TABLET | Freq: Every day | ORAL | Status: DC
  Administered 2012-05-13 – 2012-05-16 (×2): 1 via ORAL
  Filled 2012-05-12 (×9): qty 1
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
## 2012-05-12 NOTE — H&P (Signed)
31 yo [id number] @ [redacted]w[redacted]d with worsening abd and back pain.  Pt was evaluated in office Friday w/ c/o low back pain - cervix was examined and was closed, stitch in place.  Pt reports today has had diarrhea and worsening back and abd pain.  No VB or LOF.  Pt had prophylactic cerclage placed at 14 weeks.     OBHx:  '03 - EAB  '07 - 38wk pregnancy, c-section  '07 - 20wk IUFD/SVD  '09 - 30wk, cerclage placed, abruption and rpt c-section  '11 - 39wk, cerclage placed, rpt c-section  PMHx:  Negative PSHx:  appy 2012 All - IV contrast dye, NDA Meds - PNV,  FHx - n/c  AF, VSS Gen - NAD Abd - gravid, NT PV - (exam by NP), ? Membranes + stitch  [place] - no cervical length and membranes seen below cerclage, stitch in place Transverse fetus w/ + FHT  A/P:  Incompetent cervix with cervical dilation and membranes below cerclage Admit T-burg/bedrest Declines foley cath - will order bedside commode Vaginal progesterone IV antibiotics given exposed membranes

## 2012-05-13 ENCOUNTER — Inpatient Hospital Stay (HOSPITAL_COMMUNITY): Payer: BC Managed Care – PPO

## 2012-05-13 MED ORDER — AZITHROMYCIN 500 MG PO TABS
500.0000 mg | ORAL_TABLET | Freq: Every day | ORAL | Status: AC
  Administered 2012-05-13: 500 mg via ORAL
  Filled 2012-05-13: qty 1

## 2012-05-13 MED ORDER — AMOXICILLIN 250 MG PO CAPS
250.0000 mg | ORAL_CAPSULE | Freq: Three times a day (TID) | ORAL | Status: DC
  Administered 2012-05-14: 250 mg via ORAL
  Filled 2012-05-13 (×3): qty 1

## 2012-05-13 MED ORDER — AZITHROMYCIN 250 MG PO TABS
250.0000 mg | ORAL_TABLET | Freq: Every day | ORAL | Status: AC
  Administered 2012-05-14 – 2012-05-17 (×4): 250 mg via ORAL
  Filled 2012-05-13 (×4): qty 1

## 2012-05-13 MED ORDER — DOCUSATE SODIUM 100 MG PO CAPS
100.0000 mg | ORAL_CAPSULE | Freq: Two times a day (BID) | ORAL | Status: DC
  Administered 2012-05-14 – 2012-05-22 (×17): 100 mg via ORAL
  Filled 2012-05-13 (×7): qty 1
  Filled 2012-05-13: qty 2
  Filled 2012-05-13 (×11): qty 1

## 2012-05-13 MED ORDER — SIMETHICONE 80 MG PO CHEW
80.0000 mg | CHEWABLE_TABLET | Freq: Four times a day (QID) | ORAL | Status: DC | PRN
  Administered 2012-05-13 – 2012-05-18 (×5): 80 mg via ORAL

## 2012-05-13 NOTE — Progress Notes (Signed)
Patient ID: Sonya Harrison, female   DOB: 10/28/1980, 31 y.o.   MRN: [id number] Discussed MFM recommendations with patient.  She is informed of the minimal benefit, and likely harm, associated with cerclage revision.  She is counseled about the addition of Azith and continued T-burg/bedrest and vaginal progesterone.  Will get daily CBC to watch for developing chorio.  All of her questions were answered.    Mitchel Honour, DO

## 2012-05-13 NOTE — Consult Note (Signed)
Maternal Fetal Medicine Consultation  Requesting Provider(s): Mitchel Honour, DO   Reason for consultation: Incompetent cervix s/p cerclage with membranes in vagina  HPI: Sonya Harrison is a 31 yo [id number] currently at 11 2/7 weeks who was admitted over the weekend due to incompetent cervix s/p cerclage- now with membranes protruding into the vagina.  Sonya Harrison underwent a prophylactic cerclage at 14 weeks that was uncomplicated.  She is currently on IV Ampicillin and vaginal progesterone. She denies vaginal bleeding, leakage of fluid or abdominal pain.  OB History: OB History    Grav Para Term Preterm Abortions TAB SAB Ect Mult Living   6 3 2 1 2 1 1  0 0 3     03- EAB 07- 38 week C-section for NRFHT 07 -20 week loss, PROM, suspected incompetent cervix 09- 30 week C-section for abruption - had cerclage placed 11- 39 week repeat C-section (s/p cerclage)  PMH:  Past Medical History  Diagnosis Date  . Anemia   . GERD (gastroesophageal reflux disease)     tums prn  . Incompetent cervix     PSH:  Past Surgical History  Procedure Date  . Cesarean section 03/07, 04/09, 12/11  . Appendectomy 04/08/2011  . Cervical cerclage     x2  . Cervical cerclage 04/19/2012    Procedure: CERCLAGE CERVICAL;  Surgeon: [name], MD;  Location: [facility];  Service: Gynecology;  Laterality: N/A;   Meds: IV ampicillin, Prometrium intravaginally, Prenatal vitamins, TUMS  Allergies: IV contrast ("hives")  FH: neg for birth defects or hereditary disorders  Soc: denies alcohol, tobacco or illicit drug use during pregnancy  Review of Systems: no vaginal bleeding or cramping/contractions, no LOF, no nausea/vomiting. All other systems reviewed and are negative.     PE:   Filed Vitals:   05/13/12 0800  BP:   Pulse:   Temp: 98.6 F (37 C)  Resp:     GEN: well-appearing female ABD: gravid, NT  Ultrasound: Single IUP at 18 2/7 weeks TV ultrasound reveals the cervix open to  approximately 1 cm, membranes visible in the vagina The cerclage stitch appears intact Normal amniotic fluid volume  Labs: CBC    Component Value Date/Time   WBC 13.3* 05/12/2012 1906   RBC 4.26 05/12/2012 1906   HGB 11.5* 05/12/2012 1906   HCT 33.2* 05/12/2012 1906   PLT 374 05/12/2012 1906   MCV 77.9* 05/12/2012 1906   MCH 27.0 05/12/2012 1906   MCHC 34.6 05/12/2012 1906   RDW 14.7 05/12/2012 1906   LYMPHSABS 3.6 04/08/2011 0214   MONOABS 0.6 04/08/2011 0214   EOSABS 0.2 04/08/2011 0214   BASOSABS 0.0 04/08/2011 0214     A/P:1) Incompetent cervix s/p cerclage, with membranes into vagina - There is limited research on the benefit of cerclage revision for women who develop shortened cervix following history-indicated cerclage.  One trial from 2005 followed 24 women- 5 of whom had a "reinforcing cerclage" placed.  Reinforcing cerclage was associated with a significantly earlier gestational age of delivery as well as preterm delivery and previable delivery compared to expectant management. (Baxter, et al.  Am J Obstet Gynecol 2005; 147:8295-6).  Additionally, fetal membranes that have prolapsed into the vagina is at least a relative contraindication for cerclage.  The mildly elevated WBC count would also make me somewhat concerned about a developing intra-amniotic infection that would be an absolute contraindication for cerclage.  Recommend: - Expand antibiotic coverage.  Would add a course of po Azithromycin (Z-pack).  Would continue Ampicillin to complete a 72 hour course then po antiboptics for a total of 7 days - similar to latency antibiotic coverage with PROM. - Serial CBCs - would move toward delivery for any overt signs of chorioamnionitis (fevers, abdominal pain) - Continue vaginal progesterone supplements - Would not entertain cerclage revision at this time.   Thank you for the opportunity to be a part of the care of Sonya Harrison. Please contact our office if we can be of further  assistance.   I spent approximately 30 minutes with this patient with over 50% of time spent in face-to-face counseling.  Alpha Gula, MD

## 2012-05-13 NOTE — Progress Notes (Signed)
No acute events overnight; rested well.  Has not had back or abd pain since yesterday.  +FM.  No VB, LOF; no more abnormal discharge.  Very concerned because of h/o 20 wk loss.    VSS  Gen: A&O x 3, NAD, in T'burg Abd: soft, min TTP at fundus Ext: no c/c/e  31yo [id number] at [redacted]w[redacted]d with cervical insufficiency HD#2 -CI: s/p cerclage placement by Dr. [name] at 14w.  Ballooning of membranes past stitch on [place] yesterday with internal os dilated 1.1 cm.  In T'burg/bedrest overnight, IV abx for exposed membranes, vaginal progesterone started.    -Plan to repeat US with MFM today to evaluate for possibility of cerclage revision. -Counseled pt regarding possibility of cerclage revision and she is decided at this point pending [place] results.  Will discuss further later today.  Mitchel Honour, DO

## 2012-05-13 NOTE — Progress Notes (Signed)
05/13/12 1330  Clinical Encounter Type  Visited With Patient  Visit Type Spiritual support;Social support (Pt tearful re prognosis, plus hx loss @20w )  Referral From Nurse Lupita Leash, RN, ante)  Recommendations Spiritual Care will follow for spiritual/emotional support  Spiritual Encounters  Spiritual Needs Emotional;Prayer;Grief support  Stress Factors  Patient Stress Factors Loss of control;Major life changes ("so depressed," "no hope" re prognosis and hx loss)    Thank you for the timely referral to support Keanu as she began to process the difficult news that she "probably won't go home with a baby."  Sundy was tearful, heartbroken, and "so depressed," naming the "double grief" of having the prospect of losing this pregnancy bring up her past grief about delivering another baby at 20 weeks and "watching [that] baby die."  Per pt, her support is limited; her husband is tied up caring for their three children at home (6y, 10y, 50 mos), and her other local family is very busy.  Provided pastoral presence, listening, affirmation of the normal/typical nature of pt's feelings in a situation like this, and prayer at bedside.  Let Laxmi know of ongoing chaplain availability.  Plan to check in before I leave this afternoon, if possible, and again tomorrow.  Will follow for spiritual and emotional support.  [address] [place], [place] [phone]

## 2012-05-13 NOTE — Progress Notes (Signed)
05/13/12 1600  Clinical Encounter Type  Visited With Patient and family together (Sister, sister-in-law, children, friend)  Visit Type Follow-up;Spiritual support;Social support  Spiritual Encounters  Spiritual Needs Emotional    Sonya Harrison was "feeling a bit better" and looking more upbeat as she was surrounded by family during this f/u visit.  Her sister helped get her two younger children seated in bed with her while her sister-in-law served up a homemade meal for the group.  [name] had been concerned earlier about having her children see her so down and tearful, so a visit with a happy tone has brightened the afternoon and afforded her some needed support.  We agreed again that I will visit tomorrow.  [address] [place], [place] [phone]

## 2012-05-14 ENCOUNTER — Encounter: Payer: BC Managed Care – PPO | Admitting: Physical Therapy

## 2012-05-14 LAB — CBC WITH DIFFERENTIAL/PLATELET
Basophils Absolute: 0 10*3/uL (ref 0.0–0.1)
Basophils Relative: 0 % (ref 0–1)
Eosinophils Absolute: 0.3 10*3/uL (ref 0.0–0.7)
Hemoglobin: 10.7 g/dL — ABNORMAL LOW (ref 12.0–15.0)
MCH: 26.6 pg (ref 26.0–34.0)
MCHC: 33.8 g/dL (ref 30.0–36.0)
Monocytes Absolute: 0.7 10*3/uL (ref 0.1–1.0)
Monocytes Relative: 7 % (ref 3–12)
Neutrophils Relative %: 68 % (ref 43–77)
RDW: 14.5 % (ref 11.5–15.5)

## 2012-05-14 MED ORDER — ONDANSETRON 8 MG PO TBDP
8.0000 mg | ORAL_TABLET | Freq: Once | ORAL | Status: AC
  Administered 2012-05-14: 8 mg via ORAL
  Filled 2012-05-14 (×2): qty 1

## 2012-05-14 MED ORDER — HYDROCODONE-ACETAMINOPHEN 5-325 MG PO TABS
1.0000 | ORAL_TABLET | ORAL | Status: DC | PRN
  Administered 2012-05-14 – 2012-05-21 (×3): 2 via ORAL
  Filled 2012-05-14 (×3): qty 2

## 2012-05-14 NOTE — Progress Notes (Signed)
CRNA here to restart IV site

## 2012-05-14 NOTE — Progress Notes (Signed)
05/14/12 1200  Clinical Encounter Type  Visited With Patient  Visit Type Follow-up   I accidentally waked Sorayah.  She welcomed chaplain presence, and we agreed for me to follow up tomorrow if this afternoon isn't possible.  [address] [place], [place] [phone]

## 2012-05-14 NOTE — Progress Notes (Signed)
UR Chart review completed.  

## 2012-05-14 NOTE — Progress Notes (Signed)
Patient ID: Sonya Harrison, female   DOB: 22-Dec-1980, 31 y.o.   MRN: [id number] Pt reports occasional hip pain No leaking, or ctxs Has questions regarding prognosis and plan of care VSSAF FHR + Abd Soft nt Neg Homans Bil  Adv Dilation with Inc Cx Reviewed prev discussions by Dr Langston Masker and MFM with patient Plan cont T Rudell Cobb, Prog Supp, zithromycin and amp, no revision of stitch Pastoral care has given great pt comfort. Repeat US in 2 days  DL

## 2012-05-15 LAB — CBC WITH DIFFERENTIAL/PLATELET
Eosinophils Relative: 3 % (ref 0–5)
HCT: 33 % — ABNORMAL LOW (ref 36.0–46.0)
Lymphocytes Relative: 23 % (ref 12–46)
Lymphs Abs: 2.4 10*3/uL (ref 0.7–4.0)
MCV: 78.6 fL (ref 78.0–100.0)
Monocytes Absolute: 0.7 10*3/uL (ref 0.1–1.0)
RBC: 4.2 MIL/uL (ref 3.87–5.11)
WBC: 10.1 10*3/uL (ref 4.0–10.5)

## 2012-05-15 MED ORDER — AMOXICILLIN 250 MG PO CAPS
250.0000 mg | ORAL_CAPSULE | Freq: Three times a day (TID) | ORAL | Status: AC
  Administered 2012-05-15 – 2012-05-19 (×14): 250 mg via ORAL
  Filled 2012-05-15 (×14): qty 1

## 2012-05-15 MED ORDER — SODIUM CHLORIDE 0.9 % IJ SOLN
3.0000 mL | Freq: Two times a day (BID) | INTRAMUSCULAR | Status: DC
  Administered 2012-05-15: 3 mL via INTRAVENOUS

## 2012-05-15 NOTE — Progress Notes (Signed)
Patient was weighed in bed; bed was zeroed.

## 2012-05-15 NOTE — Progress Notes (Signed)
18 4/7 weeks  No C/O No bleeding / leaking  Blood pressure 96/53, pulse 102, temperature 98.6 F (37 C), temperature source Axillary, resp. rate 18, height 5\' 3"  (1.6 m), weight 79.742 kg (175 lb 12.8 oz), last menstrual period 02/06/2012, SpO2 100.00%.  Ut NT FHT +  A: Incompetent cervix/cerclage/prolapsed BOW  P: Cont BR      Cont Atb      MFM U/S in am

## 2012-05-16 ENCOUNTER — Inpatient Hospital Stay (HOSPITAL_COMMUNITY): Payer: BC Managed Care – PPO

## 2012-05-16 LAB — CBC WITH DIFFERENTIAL/PLATELET
Basophils Absolute: 0 10*3/uL (ref 0.0–0.1)
Basophils Relative: 0 % (ref 0–1)
Eosinophils Absolute: 0.2 10*3/uL (ref 0.0–0.7)
Eosinophils Relative: 2 % (ref 0–5)
HCT: 31.9 % — ABNORMAL LOW (ref 36.0–46.0)
MCH: 26.6 pg (ref 26.0–34.0)
MCHC: 33.9 g/dL (ref 30.0–36.0)
MCV: 78.6 fL (ref 78.0–100.0)
Monocytes Absolute: 0.9 10*3/uL (ref 0.1–1.0)
RDW: 14.7 % (ref 11.5–15.5)

## 2012-05-16 MED ORDER — ONDANSETRON 8 MG PO TBDP
8.0000 mg | ORAL_TABLET | Freq: Three times a day (TID) | ORAL | Status: DC | PRN
  Administered 2012-05-16 – 2012-05-20 (×3): 8 mg via ORAL
  Filled 2012-05-16 (×3): qty 1

## 2012-05-16 MED ORDER — MAGNESIUM HYDROXIDE 400 MG/5ML PO SUSP
30.0000 mL | Freq: Every day | ORAL | Status: DC | PRN
  Administered 2012-05-21: 30 mL via ORAL
  Filled 2012-05-16 (×3): qty 30

## 2012-05-16 MED ORDER — POLYETHYLENE GLYCOL 3350 17 G PO PACK
17.0000 g | PACK | Freq: Every day | ORAL | Status: DC | PRN
  Administered 2012-05-16 – 2012-05-19 (×2): 17 g via ORAL
  Filled 2012-05-16 (×3): qty 1

## 2012-05-16 NOTE — Progress Notes (Signed)
Patient ID: Sonya Harrison, female   DOB: November 12, 1980, 31 y.o.   MRN: [id number] S: NO LEAKAGE OR BLEEDING O: AF VSS      UTERUS NONTENDER      POSITIVE FHT A:  IUP AT 18.5 INCOMPETENT CERVIX      BULGING MEMBRANES P:  BEDREST SONO TODAY

## 2012-05-16 NOTE — Progress Notes (Signed)
UR Chart review completed.  

## 2012-05-16 NOTE — Progress Notes (Signed)
[facility] ultrasound  Indication: 31 yr old [id number] at [redacted]w[redacted]d with cervical insufficiency, history of preterm delivery, and now with previable cervical dilation and prolapsing membranes; s/p prophylactic cerclage.  Findings: 1. Normal fetal heart rate. 2. Fundal placenta without evidence of previa. 3. Normal amniotic fluid volume. 4. The cervical length overall looks normal; however the cervix is dilated 2cm throughout. The cerclage is in place. The membranes are seen protruding through the external cervical os.  Recommendations:  1. Cervical insufficiency: previously counseled. As previously counseled by Dr. Claudean Severance I agree that a reattempt at cerclage is likely not going to help and may worsen outcomes and therefore is not recommended. Would recommend continuing vaginal progesterone and bedrest; although there is no evidence to suggest that trendelenburg will improve outcomes therefore may consider discontinuing trendelenburg. Discussed that using the toilet and shower also has not been shown to worsen outcomes and that may be considered as well. Discussed increased risk for PPROM, preterm or previable delivery, and infection. Patient is aware. At this point do not feel there are any other interventions to offer until closer to viability; then would recommend betamethasone and possible inpatient management. Outpatient management can be considered at this time- patient should be on bedrest and check temperature 1-2x/day to monitor closely for signs/symptoms of chorioamnionitis. Also recommend frequent outpatient visits to monitor for signs/symptoms of PPROM, preterm labor, and chorioamnionitis.  2. As patient will have prolonged bed rest in this pregnancy would recommend some form of DVT prophylaxis- patient currently is on SCDs. 3. Would recommend an anatomy survey if not already performed. 4. Do not recommend any further transvaginal cervical lengths at this time as this may increase the risk for  infection and is not likely to change management at this point. 5. If diagnosed with chorioamnionitis would recommend delivery.  Results discussed with Dr. Arelia Sneddon.  Please call with further questions.  Eulis Foster, MD

## 2012-05-17 ENCOUNTER — Encounter: Payer: BC Managed Care – PPO | Admitting: Physical Therapy

## 2012-05-17 LAB — CBC WITH DIFFERENTIAL/PLATELET
Basophils Absolute: 0 10*3/uL (ref 0.0–0.1)
Basophils Relative: 0 % (ref 0–1)
HCT: 32.5 % — ABNORMAL LOW (ref 36.0–46.0)
MCHC: 34.2 g/dL (ref 30.0–36.0)
Monocytes Absolute: 0.9 10*3/uL (ref 0.1–1.0)
Neutro Abs: 8.5 10*3/uL — ABNORMAL HIGH (ref 1.7–7.7)
Neutrophils Relative %: 67 % (ref 43–77)
RDW: 14.7 % (ref 11.5–15.5)

## 2012-05-17 NOTE — Progress Notes (Signed)
Patient is stable. Denies contractions. Afebrile Vital signs stable  IMPRESSION: IUP at 18 w 6 days Incompetent cervix Cervical dilation with protruding membranes. Previous Cesarean Section x 3  PLAN: I had a lengthy discussion with patient and her husband (via telephone) about plan. I feel in patient hospitalization is necessary because patient will not be able to do bedrest at home. Continue progesterone.

## 2012-05-18 LAB — CBC WITH DIFFERENTIAL/PLATELET
Basophils Absolute: 0 10*3/uL (ref 0.0–0.1)
Basophils Relative: 0 % (ref 0–1)
Hemoglobin: 10.9 g/dL — ABNORMAL LOW (ref 12.0–15.0)
MCHC: 33.7 g/dL (ref 30.0–36.0)
Monocytes Relative: 6 % (ref 3–12)
Neutro Abs: 7.8 10*3/uL — ABNORMAL HIGH (ref 1.7–7.7)
Neutrophils Relative %: 68 % (ref 43–77)
RDW: 14.6 % (ref 11.5–15.5)

## 2012-05-18 MED ORDER — COMPLETENATE 29-1 MG PO CHEW
1.0000 | CHEWABLE_TABLET | Freq: Every day | ORAL | Status: DC
  Administered 2012-05-19 – 2012-05-22 (×4): 1 via ORAL
  Filled 2012-05-18 (×7): qty 1

## 2012-05-18 NOTE — Progress Notes (Signed)
Patient is very stable. Denies contractions or vaginal discharge. Reports fetal movement. Afebrile Vital signs stable Abdomen is soft and non tender  IMPRESSION: IUP at 19 weeks Incompetent cervix with cerclage in place Previous Cesarean Section x 3 Cervical dilation  PLAN: Continue bedrest, progesterone

## 2012-05-18 NOTE — Progress Notes (Signed)
140-145 with doppler and cardio

## 2012-05-19 LAB — CBC WITH DIFFERENTIAL/PLATELET
Hemoglobin: 11.2 g/dL — ABNORMAL LOW (ref 12.0–15.0)
Lymphocytes Relative: 23 % (ref 12–46)
Lymphs Abs: 3 10*3/uL (ref 0.7–4.0)
Monocytes Relative: 6 % (ref 3–12)
Neutro Abs: 8.7 10*3/uL — ABNORMAL HIGH (ref 1.7–7.7)
Neutrophils Relative %: 68 % (ref 43–77)
RBC: 4.18 MIL/uL (ref 3.87–5.11)
WBC: 12.9 10*3/uL — ABNORMAL HIGH (ref 4.0–10.5)

## 2012-05-19 NOTE — Progress Notes (Signed)
Patient has had a very stable weekend. Reports good fetal movement. No vaginal bleeding.  Afebrile Vital signs stable Abdomen is soft and nontender  IMPRESSION: IUP at 19 w 1 days  Cervical incompetence Cerclage in place Previous C Section x 3  PLAN: Continue bedrest Progesterone Needs an anatomic ultrasound this week - not scheduled yet Plan reviewed with the patient

## 2012-05-19 NOTE — Progress Notes (Signed)
Report given to M. Stringer, RN.  

## 2012-05-20 LAB — CBC WITH DIFFERENTIAL/PLATELET
Eosinophils Absolute: 0.3 10*3/uL (ref 0.0–0.7)
Eosinophils Relative: 2 % (ref 0–5)
Hemoglobin: 11.1 g/dL — ABNORMAL LOW (ref 12.0–15.0)
Lymphocytes Relative: 22 % (ref 12–46)
Lymphs Abs: 3 10*3/uL (ref 0.7–4.0)
MCH: 26.8 pg (ref 26.0–34.0)
MCV: 78.3 fL (ref 78.0–100.0)
Monocytes Relative: 6 % (ref 3–12)
RBC: 4.14 MIL/uL (ref 3.87–5.11)

## 2012-05-20 MED ORDER — SENNA 8.6 MG PO TABS: 2.0000 | ORAL_TABLET | Freq: Every day | ORAL | Status: DC | PRN

## 2012-05-20 MED ORDER — SENNOSIDES-DOCUSATE SODIUM 8.6-50 MG PO TABS
2.0000 | ORAL_TABLET | Freq: Every evening | ORAL | Status: DC | PRN
  Administered 2012-05-20 – 2012-05-22 (×3): 2 via ORAL

## 2012-05-20 NOTE — Care Management (Signed)
Ur chart review per insurance request.

## 2012-05-20 NOTE — Progress Notes (Signed)
Patient ID: Sonya Harrison, female   DOB: [birth year], 31 y.o.   MRN: [id number] Pt without complaints.  Occas ctx VSSAF FHR +  Abd soft Neg homans  IUP at 19 2/7 Continue bedrest  Progesterone  Needs an anatomic ultrasound this week - not scheduled yet  Plan reviewed with the patient

## 2012-05-21 ENCOUNTER — Encounter (HOSPITAL_COMMUNITY): Payer: Self-pay | Admitting: Anesthesiology

## 2012-05-21 ENCOUNTER — Ambulatory Visit: Payer: BC Managed Care – PPO | Admitting: Physical Therapy

## 2012-05-21 ENCOUNTER — Inpatient Hospital Stay (HOSPITAL_COMMUNITY): Payer: BC Managed Care – PPO

## 2012-05-21 MED ORDER — LACTATED RINGERS IV SOLN
INTRAVENOUS | Status: DC
  Administered 2012-05-22 (×4): via INTRAVENOUS
  Administered 2012-05-22: 500 mL via INTRAVENOUS
  Administered 2012-05-23: 10:00:00 via INTRAVENOUS

## 2012-05-21 MED ORDER — INDOMETHACIN 25 MG PO CAPS
25.0000 mg | ORAL_CAPSULE | Freq: Four times a day (QID) | ORAL | Status: DC
Start: 2012-05-22 — End: 2012-05-23
  Administered 2012-05-22 – 2012-05-23 (×5): 25 mg via ORAL
  Filled 2012-05-21 (×6): qty 1

## 2012-05-21 MED ORDER — FLEET ENEMA 7-19 GM/118ML RE ENEM
1.0000 | ENEMA | Freq: Once | RECTAL | Status: AC
  Administered 2012-05-21: 1 via RECTAL

## 2012-05-21 MED ORDER — GLYCERIN (LAXATIVE) 2.1 G RE SUPP
1.0000 | Freq: Once | RECTAL | Status: AC
  Administered 2012-05-21: 1 via RECTAL
  Filled 2012-05-21: qty 1

## 2012-05-21 MED ORDER — MAGNESIUM HYDROXIDE 400 MG/5ML PO SUSP
30.0000 mL | Freq: Every evening | ORAL | Status: DC | PRN
  Filled 2012-05-21: qty 30

## 2012-05-21 MED ORDER — TERBUTALINE SULFATE 1 MG/ML IJ SOLN
INTRAMUSCULAR | Status: AC
  Filled 2012-05-21: qty 1

## 2012-05-21 MED ORDER — LACTATED RINGERS IV BOLUS (SEPSIS)
500.0000 mL | Freq: Once | INTRAVENOUS | Status: AC
  Administered 2012-05-21: 500 mL via INTRAVENOUS

## 2012-05-21 MED ORDER — INDOMETHACIN 50 MG RE SUPP
50.0000 mg | Freq: Once | RECTAL | Status: AC
  Administered 2012-05-22: 50 mg via RECTAL
  Filled 2012-05-21: qty 1

## 2012-05-21 MED ORDER — BUTORPHANOL TARTRATE 1 MG/ML IJ SOLN
2.0000 mg | Freq: Once | INTRAMUSCULAR | Status: AC
  Administered 2012-05-22: 2 mg via INTRAVENOUS
  Filled 2012-05-21: qty 2

## 2012-05-21 MED ORDER — TERBUTALINE SULFATE 1 MG/ML IJ SOLN
0.2500 mg | Freq: Once | INTRAMUSCULAR | Status: AC
  Administered 2012-05-21: 1 mg via SUBCUTANEOUS

## 2012-05-21 NOTE — Progress Notes (Signed)
Patient ID: Sonya Harrison, female   DOB: 03/03/1981, 32 y.o.   MRN: [id number] S: NO CTX OR LEAKAGE O:  AF VSS       GRAVID UTERUS NONTENDER      POST FHT A:  IUP AT 19.3 FAILED CERCLAGE WITH HOUR GLASSING MEMBRANES P:  DETAILED ANATOMIC SONO CONTINUE PROGESTERONE

## 2012-05-21 NOTE — Progress Notes (Signed)
Bedside ultrasound in progress.

## 2012-05-21 NOTE — Progress Notes (Signed)
Sonya Harrison  was seen today for an ultrasound appointment.  See full report in [facility].  Alpha Gula, MD  Single IUP at 19 3/7 weeks Normal detailed fetal anatomy No markers associated with aneuploidy noted Normal amniotic fluid volume  Fetal membranes again noted prolapsing into the vagina - cerclage stich appears intact (Transabdomninal ultrasound)  Recommend follow up ultrasounds as clinically indicated. Continue bedrest- may consider close outpatient management if stable and no signs of infection - readmission at 23-[redacted] weeks gestation Would move toward delivery if patient develops clinical findings suspicious for chorionamnionitis

## 2012-05-22 ENCOUNTER — Other Ambulatory Visit (HOSPITAL_COMMUNITY): Payer: BC Managed Care – PPO

## 2012-05-22 LAB — CBC
HCT: 29.3 % — ABNORMAL LOW (ref 36.0–46.0)
Hemoglobin: 10 g/dL — ABNORMAL LOW (ref 12.0–15.0)
MCH: 26.9 pg (ref 26.0–34.0)
MCHC: 34.1 g/dL (ref 30.0–36.0)
RDW: 14.4 % (ref 11.5–15.5)

## 2012-05-22 MED ORDER — AMPICILLIN SODIUM 1 G IJ SOLR
1.0000 g | INTRAMUSCULAR | Status: DC
  Administered 2012-05-23 (×3): 1 g via INTRAVENOUS
  Filled 2012-05-22 (×8): qty 1000

## 2012-05-22 MED ORDER — BUTORPHANOL TARTRATE 1 MG/ML IJ SOLN
2.0000 mg | INTRAMUSCULAR | Status: DC | PRN
  Administered 2012-05-22: 2 mg via INTRAVENOUS
  Filled 2012-05-22: qty 2

## 2012-05-22 MED ORDER — AMPICILLIN SODIUM 2 G IJ SOLR
2.0000 g | Freq: Once | INTRAMUSCULAR | Status: AC
  Administered 2012-05-22: 2 g via INTRAVENOUS
  Filled 2012-05-22: qty 2000

## 2012-05-22 MED ORDER — CLINDAMYCIN PHOSPHATE 900 MG/50ML IV SOLN
900.0000 mg | Freq: Three times a day (TID) | INTRAVENOUS | Status: DC
  Administered 2012-05-22 – 2012-05-23 (×2): 900 mg via INTRAVENOUS
  Filled 2012-05-22 (×4): qty 50

## 2012-05-22 MED ORDER — DEXTROSE 5 % IV SOLN
120.0000 mg | Freq: Three times a day (TID) | INTRAVENOUS | Status: DC
  Administered 2012-05-22 – 2012-05-23 (×2): 120 mg via INTRAVENOUS
  Filled 2012-05-22 (×4): qty 3

## 2012-05-22 NOTE — Progress Notes (Signed)
Pt refuse to wear continuous pulse oximetry even with elevated HR. Pt also refuses to wear toco monitor. Pt states what is the point in any of this. Pt states she wants to wait til the morning for cerclage removal as husband cannot come to hospital to be with her. Dr Renaldo Fiddler aware and acknowledge.

## 2012-05-22 NOTE — Progress Notes (Signed)
Pt reports contractions from yesterday and last night have resolved.  Only feeling rare, mild ctx.  No vb or lof.    AF, VSS Abd - fundus NT Ext - NT  A/P:  19+4 wks with incompetent cervix, cerclage - bulging membranes Continue indomethacin, bedrest

## 2012-05-22 NOTE — Progress Notes (Signed)
Pt declines cerclage removal until husband able to come (may be morning) Discussed risks of sepsis. Continue IV antibiotics

## 2012-05-22 NOTE — Progress Notes (Addendum)
ANTIBIOTIC CONSULT NOTE - INITIAL  Pharmacy Consult for Gentamicin Indication: Chorioamnionitis  Allergies  Allergen Reactions  . Contrast Media (Iodinated Diagnostic Agents) Hives    Patient Measurements: Height: 5\' 3"  (160 cm) Weight: 157 lb (71.215 kg) IBW/kg (Calculated) : 52.4  Adjusted Body Weight: 58kg  Vital Signs: Temp: 103.1 F (39.5 C) (10/09 1958) Temp src: Oral (10/09 1958) BP: 94/76 mmHg (10/09 1958) Pulse Rate: 148  (10/09 2121)   Labs:  Basename 05/22/12 1900 05/20/12 0500  WBC 18.5* 13.2*  HGB 10.0* 11.1*  PLT 304 361  LABCREA -- --  CREATININE -- --   Estimated Creatinine Clearance: 94.6 ml/min (by C-G formula based on Cr of 0.7).  Medical History: Past Medical History  Diagnosis Date  . Anemia   . GERD (gastroesophageal reflux disease)     tums prn  . Incompetent cervix     Medications:  Ampicillin 2 gram IV x 1 then 1 gram IV q4h Assessment: 31yo F 19 weeks admitted with incompetent cervix and PTL. Ampicillin and Gentamicin started for chorioamnionitis for temp > 103.  Goal of Therapy:  Gentamicin peaks 6-36mcg/ml and trough < 3mcg/ml.  Plan:  1. Gentamicin 120mg  IV q8h. 2. Will draw SCreatinine in am to assess current renal function (last lab drawn 8/13). 3. Will continue to follow and draw Gentamicin levels as clinically indicated. Thanks!  Claybon Jabs 05/22/2012,9:49 PM  Gentamicin re-started s/p D&E for retained placenta. Will continue Current Gentamicin regimen of  Gentamicin 120mg  IV q8h (mixed with Clindamycin).  Scr today=0.47 with CrCl > 148ml/min. Will continue to follow and assess need for Gentamicin levels. Thanks!

## 2012-05-22 NOTE — Progress Notes (Signed)
19 4/[redacted] weeks gestation, with incompetent cervix,  cerclage.  Height  63" Weight 157 Lbs pre-pregnancy weight 172 Lbs.Pre-pregnancy  BMI 30.5  IBW 115 Lbs Pt feels that weight is inaccurate. Pt fells that she has gained at least 6 Lbs  Total weight gain 0 per [facility]. Weight gain goals 11-20 Lbs.   Estimated needs: 18-2000 kcal/day, 65-75  grams protein/day, 2.2 liters fluid/day Regular diet tolerated well, appetite good. Pt reports eating 3 meals per day, requests double portions. Oriented to ordering of snacks Current diet prescription will provide for increased needs. No abnormal nutrition related labs  Nutrition Dx: Increased nutrient needs r/t pregnancy and fetal growth requirements aeb [redacted] weeks gestation.  No educational needs assessed at this time.  Elisabeth Cara M.Odis Luster LDN Neonatal Nutrition Support Specialist Pager [phone]

## 2012-05-22 NOTE — Progress Notes (Signed)
Pt called out complaining of cant breathe. My heart is beating fast. Pulse ox applied. Dr Renaldo Fiddler called and orders received.

## 2012-05-22 NOTE — Progress Notes (Signed)
Pt c/o increase in ctx over past 1-2 hrs - Not as severe as last night but increased since this am.  Pt has noticed increased bleeding on last pad.  No leaking clear fluid.    AF, VSS Gen - NAD but uncomfortable w/ ctx Abd - gravid, NT PV - deferred  A/P:  Incompetent cervix with PTL Continue indomethacin Stadol prn Discussed need to remove cerclage if heavy, active VB

## 2012-05-22 NOTE — Progress Notes (Signed)
Pt called out. pinkish tinge noted in urine after voiding. Nothing noted on pad.

## 2012-05-22 NOTE — Progress Notes (Signed)
Pt called out this evening w/ c/o heart palpitations and SOB.  Pt noted to be tachycardic (150s-160s) and O2 sats 98%.  CBC ordered & i came over to evaluate pt.  Pt now continues to c/o palpitations a/w chills & painful contractions.  Temp 103 HR 153 RR 20 BP 90s/70s  Gen - uncomfortable w/ contractions Abd - tender fundus Ext - NT  CBC - WBC 18, Hgb 10, Plts 304  A/P:  19 weeks, chorioamnionitis Tylenol, Amp, & Gent Rec to pt that we remove cerclage and deliver Pt calling husband to come in for support.   Will go back in when he arrives to re-discuss plan of care

## 2012-05-23 ENCOUNTER — Encounter (HOSPITAL_COMMUNITY)
Admission: AD | Disposition: A | Discharge status: Home / Self Care | Payer: Self-pay | Source: Ambulatory Visit | Attending: Obstetrics and Gynecology

## 2012-05-23 ENCOUNTER — Ambulatory Visit: Admit: 2012-05-23 | Payer: Self-pay | Admitting: Obstetrics and Gynecology

## 2012-05-23 ENCOUNTER — Encounter (HOSPITAL_COMMUNITY): Payer: Self-pay | Admitting: Anesthesiology

## 2012-05-23 ENCOUNTER — Inpatient Hospital Stay (HOSPITAL_COMMUNITY): Payer: BC Managed Care – PPO | Admitting: Anesthesiology

## 2012-05-23 ENCOUNTER — Encounter (HOSPITAL_COMMUNITY): Payer: Self-pay | Admitting: *Deleted

## 2012-05-23 ENCOUNTER — Inpatient Hospital Stay (HOSPITAL_COMMUNITY): Payer: BC Managed Care – PPO

## 2012-05-23 HISTORY — PX: DILATION AND CURETTAGE OF UTERUS: SHX78

## 2012-05-23 LAB — CBC
HCT: 25.5 % — ABNORMAL LOW (ref 36.0–46.0)
Hemoglobin: 8.9 g/dL — ABNORMAL LOW (ref 12.0–15.0)
Hemoglobin: 6.1 g/dL — CL (ref 12.0–15.0)
MCH: 27.3 pg (ref 26.0–34.0)
MCH: 27.1 pg (ref 26.0–34.0)
MCHC: 34.9 g/dL (ref 30.0–36.0)
MCHC: 34.7 g/dL (ref 30.0–36.0)
RDW: 14.5 % (ref 11.5–15.5)

## 2012-05-23 LAB — PREPARE RBC (CROSSMATCH)

## 2012-05-23 SURGERY — DILATION AND CURETTAGE
Anesthesia: Epidural | Site: Uterus | Wound class: Clean Contaminated

## 2012-05-23 MED ORDER — DIPHENHYDRAMINE HCL 25 MG PO CAPS
25.0000 mg | ORAL_CAPSULE | ORAL | Status: DC | PRN
  Administered 2012-05-25: 25 mg via ORAL
  Filled 2012-05-23: qty 1

## 2012-05-23 MED ORDER — NALBUPHINE HCL 10 MG/ML IJ SOLN
5.0000 mg | INTRAMUSCULAR | Status: DC | PRN
  Filled 2012-05-23: qty 1

## 2012-05-23 MED ORDER — PHENYLEPHRINE 40 MCG/ML (10ML) SYRINGE FOR IV PUSH (FOR BLOOD PRESSURE SUPPORT)
PREFILLED_SYRINGE | INTRAVENOUS | Status: AC
  Filled 2012-05-23: qty 5

## 2012-05-23 MED ORDER — LACTATED RINGERS IV SOLN
500.0000 mL | Freq: Once | INTRAVENOUS | Status: AC
  Administered 2012-05-23: 500 mL via INTRAVENOUS

## 2012-05-23 MED ORDER — ACETAMINOPHEN 650 MG RE SUPP
650.0000 mg | RECTAL | Status: DC | PRN
  Administered 2012-05-23: 650 mg via RECTAL
  Filled 2012-05-23: qty 1

## 2012-05-23 MED ORDER — BENZOCAINE-MENTHOL 20-0.5 % EX AERO: 1.0000 "application " | INHALATION_SPRAY | CUTANEOUS | Status: DC | PRN

## 2012-05-23 MED ORDER — PHENYLEPHRINE HCL 10 MG/ML IJ SOLN
30.0000 ug/min | INTRAVENOUS | Status: DC
  Administered 2012-05-23: 35 ug/min via INTRAVENOUS
  Administered 2012-05-23: 40 ug/min via INTRAVENOUS
  Administered 2012-05-24: 15 ug/min via INTRAVENOUS
  Filled 2012-05-23 (×3): qty 1

## 2012-05-23 MED ORDER — LANOLIN HYDROUS EX OINT: TOPICAL_OINTMENT | CUTANEOUS | Status: DC | PRN

## 2012-05-23 MED ORDER — LIDOCAINE HCL (PF) 1 % IJ SOLN
INTRAMUSCULAR | Status: DC | PRN
  Administered 2012-05-23 (×2): 9 mL

## 2012-05-23 MED ORDER — SODIUM CHLORIDE 0.9 % IV SOLN: 1.0000 g | Freq: Four times a day (QID) | INTRAVENOUS | Status: DC

## 2012-05-23 MED ORDER — EPHEDRINE 5 MG/ML INJ: 10.0000 mg | INTRAVENOUS | Status: DC | PRN

## 2012-05-23 MED ORDER — FENTANYL 2.5 MCG/ML BUPIVACAINE 1/10 % EPIDURAL INFUSION (WH - ANES): 14.0000 mL/h | INTRAMUSCULAR | Status: DC

## 2012-05-23 MED ORDER — NALOXONE HCL 0.4 MG/ML IJ SOLN: 0.4000 mg | INTRAMUSCULAR | Status: DC | PRN

## 2012-05-23 MED ORDER — ONDANSETRON HCL 4 MG/2ML IJ SOLN: 4.0000 mg | INTRAMUSCULAR | Status: DC | PRN

## 2012-05-23 MED ORDER — DIPHENHYDRAMINE HCL 50 MG/ML IJ SOLN: 12.5000 mg | INTRAMUSCULAR | Status: DC | PRN

## 2012-05-23 MED ORDER — KETOROLAC TROMETHAMINE 60 MG/2ML IM SOLN: 60.0000 mg | Freq: Once | INTRAMUSCULAR | Status: AC | PRN

## 2012-05-23 MED ORDER — IBUPROFEN 600 MG PO TABS
600.0000 mg | ORAL_TABLET | Freq: Four times a day (QID) | ORAL | Status: DC
  Administered 2012-05-23 – 2012-05-26 (×10): 600 mg via ORAL
  Filled 2012-05-23 (×11): qty 1

## 2012-05-23 MED ORDER — EPHEDRINE 5 MG/ML INJ
INTRAVENOUS | Status: AC
  Filled 2012-05-23: qty 4

## 2012-05-23 MED ORDER — OXYTOCIN 10 UNIT/ML IJ SOLN
INTRAMUSCULAR | Status: DC | PRN
  Administered 2012-05-23: 20 [IU]

## 2012-05-23 MED ORDER — SODIUM BICARBONATE 8.4 % IV SOLN
INTRAVENOUS | Status: DC | PRN
  Administered 2012-05-23: 5 mL via EPIDURAL

## 2012-05-23 MED ORDER — FENTANYL CITRATE 0.05 MG/ML IJ SOLN: INTRAMUSCULAR | Status: DC | PRN

## 2012-05-23 MED ORDER — PRENATAL MULTIVITAMIN CH: 1.0000 | ORAL_TABLET | Freq: Every day | ORAL | Status: DC

## 2012-05-23 MED ORDER — OXYCODONE-ACETAMINOPHEN 5-325 MG PO TABS
1.0000 | ORAL_TABLET | ORAL | Status: DC | PRN
  Administered 2012-05-24: 2 via ORAL
  Filled 2012-05-23 (×2): qty 2

## 2012-05-23 MED ORDER — LACTATED RINGERS IV SOLN
INTRAVENOUS | Status: DC | PRN
  Administered 2012-05-23 (×3): via INTRAVENOUS

## 2012-05-23 MED ORDER — ZOLPIDEM TARTRATE 5 MG PO TABS
5.0000 mg | ORAL_TABLET | Freq: Every evening | ORAL | Status: DC | PRN
  Administered 2012-05-23 – 2012-05-26 (×3): 5 mg via ORAL
  Filled 2012-05-23 (×3): qty 1

## 2012-05-23 MED ORDER — PROMETHAZINE HCL 25 MG/ML IJ SOLN: 6.2500 mg | INTRAMUSCULAR | Status: DC | PRN

## 2012-05-23 MED ORDER — ONDANSETRON HCL 4 MG/2ML IJ SOLN
INTRAMUSCULAR | Status: DC | PRN
  Administered 2012-05-23: 4 mg via INTRAVENOUS

## 2012-05-23 MED ORDER — LACTATED RINGERS IV SOLN
INTRAVENOUS | Status: DC
  Administered 2012-05-23 – 2012-05-24 (×3): via INTRAVENOUS

## 2012-05-23 MED ORDER — PHENYLEPHRINE 40 MCG/ML (10ML) SYRINGE FOR IV PUSH (FOR BLOOD PRESSURE SUPPORT)
PREFILLED_SYRINGE | INTRAVENOUS | Status: AC
  Filled 2012-05-23: qty 15

## 2012-05-23 MED ORDER — ONDANSETRON HCL 4 MG PO TABS: 4.0000 mg | ORAL_TABLET | ORAL | Status: DC | PRN

## 2012-05-23 MED ORDER — FENTANYL CITRATE 0.05 MG/ML IJ SOLN
INTRAMUSCULAR | Status: AC
  Filled 2012-05-23: qty 5

## 2012-05-23 MED ORDER — GENTAMICIN SULFATE 40 MG/ML IJ SOLN
Freq: Three times a day (TID) | INTRAVENOUS | Status: DC
  Administered 2012-05-23 – 2012-05-26 (×8): via INTRAVENOUS
  Filled 2012-05-23 (×9): qty 3

## 2012-05-23 MED ORDER — FENTANYL 2.5 MCG/ML BUPIVACAINE 1/10 % EPIDURAL INFUSION (WH - ANES)
INTRAMUSCULAR | Status: DC | PRN
  Administered 2012-05-23: 14 mL/h via EPIDURAL

## 2012-05-23 MED ORDER — FENTANYL CITRATE 0.05 MG/ML IJ SOLN
INTRAMUSCULAR | Status: DC | PRN
  Administered 2012-05-23: 100 ug via INTRAVENOUS
  Administered 2012-05-23: 50 ug via INTRAVENOUS

## 2012-05-23 MED ORDER — SODIUM CHLORIDE 0.9 % IV SOLN
2.0000 g | Freq: Four times a day (QID) | INTRAVENOUS | Status: DC
  Administered 2012-05-23 – 2012-05-26 (×11): 2 g via INTRAVENOUS
  Filled 2012-05-23 (×12): qty 2000

## 2012-05-23 MED ORDER — FENTANYL 2.5 MCG/ML BUPIVACAINE 1/10 % EPIDURAL INFUSION (WH - ANES)
INTRAMUSCULAR | Status: AC
  Filled 2012-05-23: qty 125

## 2012-05-23 MED ORDER — PHENYLEPHRINE 40 MCG/ML (10ML) SYRINGE FOR IV PUSH (FOR BLOOD PRESSURE SUPPORT): 80.0000 ug | PREFILLED_SYRINGE | INTRAVENOUS | Status: DC | PRN

## 2012-05-23 MED ORDER — MIDAZOLAM HCL 2 MG/2ML IJ SOLN
INTRAMUSCULAR | Status: AC
  Administered 2012-05-23: 2 mg
  Filled 2012-05-23: qty 2

## 2012-05-23 MED ORDER — OXYTOCIN 40 UNITS IN LACTATED RINGERS INFUSION - SIMPLE MED
INTRAVENOUS | Status: AC
  Filled 2012-05-23: qty 1000

## 2012-05-23 MED ORDER — HYDROMORPHONE HCL PF 1 MG/ML IJ SOLN: 0.2500 mg | INTRAMUSCULAR | Status: DC | PRN

## 2012-05-23 MED ORDER — SODIUM CHLORIDE 0.9 % IV SOLN
250.0000 mL | INTRAVENOUS | Status: DC | PRN
Start: 2012-05-23 — End: 2012-05-26

## 2012-05-23 MED ORDER — BISACODYL 10 MG RE SUPP: 10.0000 mg | Freq: Every day | RECTAL | Status: DC | PRN

## 2012-05-23 MED ORDER — MEPERIDINE HCL 25 MG/ML IJ SOLN: 6.2500 mg | INTRAMUSCULAR | Status: DC | PRN

## 2012-05-23 MED ORDER — WITCH HAZEL-GLYCERIN EX PADS: 1.0000 "application " | MEDICATED_PAD | CUTANEOUS | Status: DC | PRN

## 2012-05-23 MED ORDER — KETOROLAC TROMETHAMINE 30 MG/ML IJ SOLN: 30.0000 mg | Freq: Four times a day (QID) | INTRAMUSCULAR | Status: AC | PRN

## 2012-05-23 MED ORDER — KETOROLAC TROMETHAMINE 30 MG/ML IJ SOLN: 15.0000 mg | Freq: Once | INTRAMUSCULAR | Status: DC | PRN

## 2012-05-23 MED ORDER — DIBUCAINE 1 % RE OINT: 1.0000 "application " | TOPICAL_OINTMENT | RECTAL | Status: DC | PRN

## 2012-05-23 MED ORDER — ONDANSETRON HCL 4 MG/2ML IJ SOLN: 4.0000 mg | Freq: Three times a day (TID) | INTRAMUSCULAR | Status: DC | PRN

## 2012-05-23 MED ORDER — SCOPOLAMINE 1 MG/3DAYS TD PT72: 1.0000 | MEDICATED_PATCH | Freq: Once | TRANSDERMAL | Status: DC

## 2012-05-23 MED ORDER — SENNOSIDES-DOCUSATE SODIUM 8.6-50 MG PO TABS
2.0000 | ORAL_TABLET | Freq: Every day | ORAL | Status: DC
  Administered 2012-05-23 – 2012-05-24 (×2): 2 via ORAL

## 2012-05-23 MED ORDER — CITRIC ACID-SODIUM CITRATE 334-500 MG/5ML PO SOLN
ORAL | Status: AC
  Administered 2012-05-23: 30 mL
  Filled 2012-05-23: qty 15

## 2012-05-23 MED ORDER — SODIUM CHLORIDE 0.9 % IV SOLN
1.0000 ug/kg/h | INTRAVENOUS | Status: DC | PRN
  Filled 2012-05-23: qty 2.5

## 2012-05-23 MED ORDER — TETANUS-DIPHTH-ACELL PERTUSSIS 5-2.5-18.5 LF-MCG/0.5 IM SUSP
0.5000 mL | Freq: Once | INTRAMUSCULAR | Status: DC
  Filled 2012-05-23: qty 0.5

## 2012-05-23 MED ORDER — MIDAZOLAM HCL 2 MG/2ML IJ SOLN: 2.0000 mg | Freq: Once | INTRAMUSCULAR | Status: DC

## 2012-05-23 MED ORDER — PHENYLEPHRINE HCL 10 MG/ML IJ SOLN
INTRAMUSCULAR | Status: DC | PRN
  Administered 2012-05-23 (×19): 80 ug via INTRAVENOUS

## 2012-05-23 MED ORDER — SODIUM CHLORIDE 0.9 % IJ SOLN
3.0000 mL | INTRAMUSCULAR | Status: DC | PRN
  Administered 2012-05-24: 3 mL via INTRAVENOUS

## 2012-05-23 MED ORDER — DIPHENHYDRAMINE HCL 50 MG/ML IJ SOLN: 25.0000 mg | INTRAMUSCULAR | Status: DC | PRN

## 2012-05-23 MED ORDER — CLINDAMYCIN PHOSPHATE 900 MG/50ML IV SOLN: 900.0000 mg | Freq: Three times a day (TID) | INTRAVENOUS | Status: DC

## 2012-05-23 MED ORDER — SIMETHICONE 80 MG PO CHEW: 80.0000 mg | CHEWABLE_TABLET | ORAL | Status: DC | PRN

## 2012-05-23 MED ORDER — MIDAZOLAM BOLUS VIA INFUSION
2.0000 mg | Freq: Once | INTRAVENOUS | Status: DC
  Filled 2012-05-23 (×3): qty 2

## 2012-05-23 MED ORDER — FLEET ENEMA 7-19 GM/118ML RE ENEM: 1.0000 | ENEMA | Freq: Every day | RECTAL | Status: DC | PRN

## 2012-05-23 MED ORDER — ACETAMINOPHEN 325 MG PO TABS
650.0000 mg | ORAL_TABLET | ORAL | Status: DC | PRN
  Administered 2012-05-23 – 2012-05-24 (×2): 650 mg via ORAL
  Filled 2012-05-23: qty 2
  Filled 2012-05-23 (×2): qty 1
  Filled 2012-05-23: qty 2

## 2012-05-23 MED ORDER — METOCLOPRAMIDE HCL 5 MG/ML IJ SOLN: 10.0000 mg | Freq: Three times a day (TID) | INTRAMUSCULAR | Status: DC | PRN

## 2012-05-23 MED ORDER — DIPHENHYDRAMINE HCL 25 MG PO CAPS: 25.0000 mg | ORAL_CAPSULE | Freq: Four times a day (QID) | ORAL | Status: DC | PRN

## 2012-05-23 SURGICAL SUPPLY — 11 items
CATH ROBINSON RED A/P 16FR (CATHETERS) ×2 IMPLANT
CLOTH BEACON ORANGE TIMEOUT ST (SAFETY) ×2 IMPLANT
DRAPE HYSTEROSCOPY (DRAPE) ×2 IMPLANT
GLOVE BIO SURGEON STRL SZ8 (GLOVE) ×4 IMPLANT
GOWN PREVENTION PLUS LG XLONG (DISPOSABLE) ×2 IMPLANT
GOWN STRL REIN XL XLG (GOWN DISPOSABLE) ×2 IMPLANT
PACK VAGINAL MINOR WOMEN LF (CUSTOM PROCEDURE TRAY) ×2 IMPLANT
PAD OB MATERNITY 4.3X12.25 (PERSONAL CARE ITEMS) ×2 IMPLANT
PAD PREP 24X48 CUFFED NSTRL (MISCELLANEOUS) ×2 IMPLANT
TOWEL OR 17X24 6PK STRL BLUE (TOWEL DISPOSABLE) ×4 IMPLANT
WATER STERILE IRR 1000ML POUR (IV SOLUTION) ×2 IMPLANT

## 2012-05-23 NOTE — Anesthesia Procedure Notes (Signed)
Epidural Patient location during procedure: OB Start time: 05/23/2012 12:54 PM End time: 05/23/2012 12:58 PM  Staffing Anesthesiologist: Sandrea Hughs Performed by: anesthesiologist   Preanesthetic Checklist Completed: patient identified, site marked, surgical consent, pre-op evaluation, timeout performed, IV checked, risks and benefits discussed and monitors and equipment checked  Epidural Patient position: sitting Prep: site prepped and draped and DuraPrep Patient monitoring: continuous pulse ox and blood pressure Approach: midline Injection technique: LOR air  Needle:  Needle type: Tuohy  Needle gauge: 17 G Needle length: 9 cm and 9 Needle insertion depth: 5 cm cm Catheter type: closed end flexible Catheter size: 19 Gauge Catheter at skin depth: 10 cm Test dose: negative and Other  Assessment Sensory level: T9 Events: blood not aspirated, injection not painful, no injection resistance, negative IV test and no paresthesia  Additional Notes Reason for block:procedure for pain

## 2012-05-23 NOTE — Consult Note (Signed)
Per request of Ms Urick, I was called to evaluate product of delivery. Fetus was in RW, skin is very transparent, marked bruising of the legs, genital area and back. L leg is swollen. Eyes fused. Physical features are very immature consistent with 19 weeks. No anomalies.  Impression: Previable preterm. No intervention.  I spoke to parents and gave them my impression.  Awad Gladd Q

## 2012-05-23 NOTE — Anesthesia Postprocedure Evaluation (Signed)
Anesthesia Post Note  Patient: Sonya Harrison  Procedure(s) Performed: Procedure(s) (LRB): DILATATION AND CURETTAGE (N/A)  Anesthesia type: Epidural  Patient location: PACU  Post pain: Pain level controlled  Post assessment: Post-op Vital signs reviewed  Last Vitals:  Filed Vitals:   05/23/12 1815  BP: 82/43  Pulse: 137  Temp:   Resp: 23    Post vital signs: Reviewed  Level of consciousness: awake  Complications: No apparent anesthesia complications. Pt has received 2u of PRBC and has had her neosynephrine drip d/c/d

## 2012-05-23 NOTE — Progress Notes (Signed)
Rush Barer Chaplain at bedside praying with pt. Dr Eusebio Friendly discussing clipping cerclage, pt expressing concerns over not having enough time to get pain meds or epidural. Will consult to anesthesia for pain management.

## 2012-05-23 NOTE — Progress Notes (Signed)
05/23/12 1300  Clinical Encounter Type  Visited With Patient and family together;Health care provider  Visit Type Follow-up;Spiritual support  Spiritual Encounters  Spiritual Needs Emotional;Grief support    Visited with Sonya Harrison and her husband prior to Tomblin's clipping the cerclage and again with Sonya Harrison following epidural.  She is grateful for team's quick response to her desire for pain meds, saying, "If the pain is managed, I'll be okay,"--that is, that she can handle the emotional part.  Per pt, she is ready to move through this stage of the process (delivery), and she needs some quality rest and deep sleep.  Provided pastoral presence, witness to her story, and empathic listening through these stages.  Continuing to provide prayer for her.  Spiritual Care will follow for bereavement support and further opportunity to share and process her feelings.  [address] [place], [place] [phone]

## 2012-05-23 NOTE — Progress Notes (Signed)
Pt verbalized desire to have cerclage removed this am once her husband arrives. RN offered to listen to fetal heart tones but pt declined at this time. Pt has sister and mom at the bedside with her.

## 2012-05-23 NOTE — Anesthesia Preprocedure Evaluation (Signed)
Anesthesia Evaluation  Patient identified by MRN, date of birth, ID band Patient awake    Reviewed: Allergy & Precautions, H&P , Patient's Chart, lab work & pertinent test results  Airway Mallampati: II TM Distance: >3 FB Neck ROM: full    Dental No notable dental hx.    Pulmonary neg pulmonary ROS,  breath sounds clear to auscultation  Pulmonary exam normal       Cardiovascular negative cardio ROS  Rhythm:regular Rate:Normal     Neuro/Psych negative neurological ROS  negative psych ROS   GI/Hepatic negative GI ROS, Neg liver ROS,   Endo/Other  negative endocrine ROS  Renal/GU negative Renal ROS     Musculoskeletal   Abdominal   Peds  Hematology negative hematology ROS (+)   Anesthesia Other Findings Anemia     GERD (gastroesophageal reflux disease)   tums prn    Incompetent cervix Chorioamnionitis on ABX SVT transient non syncopal   Reproductive/Obstetrics (+) Pregnancy                           Anesthesia Physical Anesthesia Plan  ASA: III  Anesthesia Plan: Epidural   Post-op Pain Management:    Induction:   Airway Management Planned:   Additional Equipment:   Intra-op Plan:   Post-operative Plan:   Informed Consent: I have reviewed the patients History and Physical, chart, labs and discussed the procedure including the risks, benefits and alternatives for the proposed anesthesia with the patient or authorized representative who has indicated his/her understanding and acceptance.     Plan Discussed with:   Anesthesia Plan Comments:         Anesthesia Quick Evaluation

## 2012-05-23 NOTE — Progress Notes (Signed)
Serum creatinine is 0.47mg /dL this morning.  Continue current gentamicin regimen.

## 2012-05-23 NOTE — Transfer of Care (Signed)
Immediate Anesthesia Transfer of Care Note  Patient: Sonya Harrison  Procedure(s) Performed: Procedure(s) (LRB) with comments: DILATATION AND CURETTAGE (N/A)  Patient Location: PACU  Anesthesia Type: Epidural  Level of Consciousness: awake, alert  and oriented  Airway & Oxygen Therapy: Patient Spontanous Breathing and Patient connected to nasal cannula oxygen  Post-op Assessment: Report given to PACU RN  Post vital signs: stable. Phenylephrine for systolic BP 79.  Complications: No apparent anesthesia complications

## 2012-05-23 NOTE — Progress Notes (Signed)
Procedure Patient transferred to L&D. Per patient request, epidural placed. Speculum exam - BOW just inside introitus. During attempt to get to knot, BOW ruptures for foul smelling fluid. Stitch cut, feet followed by rest of baby rapidly follows. With baby, about 100cc of clot passes.  NVMI noted. Neonatology called to room and confirms exam C/W 19-20 weeks.  Patient requests medicine for "nerves". I D/W Dr Arby Barrette due to possible trip to OR for placenta. He recommends Versed 2 mg IV-ordered. Patient given Versed.  Upon exam noted to be febrile. T=104 degrees. Placenta will not deliver. D/W patient and husband above. Rec: D&E. D/W risks including infection, uterine perforation and organ damage, bleeding and transfusion-HIV/Hep, IU synechiae and secondary infertility.  Both patient and husband acknowledge understanding and all questions answered.

## 2012-05-23 NOTE — Brief Op Note (Signed)
05/12/2012 - 05/23/2012  3:38 PM  PATIENT:  Sonya Harrison  31 y.o. female  PRE-OPERATIVE DIAGNOSIS:  Retained Placenta Membranes  POST-OPERATIVE DIAGNOSIS:  Retained Placenta Membranes  PROCEDURE:  Procedure(s) (LRB) with comments: DILATATION AND CURETTAGE (N/A)  SURGEON:  Surgeon(s) and Role:    * Leslie Andrea, MD - Primary  PHYSICIAN ASSISTANT:   ASSISTANTS: none   ANESTHESIA:   epidural  EBL:  Total I/O In: 2000 [I.V.:2000] Out: 650 [Urine:250; Blood:400]  BLOOD ADMINISTERED:none  DRAINS: Urinary Catheter (Foley)   LOCAL MEDICATIONS USED:  NONE  SPECIMEN:  Source of Specimen:  placenta  DISPOSITION OF SPECIMEN:  PATHOLOGY  COUNTS:  YES  TOURNIQUET:  * No tourniquets in log *  DICTATION: .Other Dictation: Dictation Number [id number]  PLAN OF CARE: Transfer to AICU  PATIENT DISPOSITION:  PACU - hemodynamically stable.   Delay start of Pharmacological VTE agent (>24hrs) due to surgical blood loss or risk of bleeding: not applicable

## 2012-05-23 NOTE — Progress Notes (Signed)
UR Chart review completed.  

## 2012-05-23 NOTE — Progress Notes (Addendum)
1402 Dr Henderson Cloud attempting to manually remove placenta. Unable to do so, discussing with pt need for D&E.

## 2012-05-23 NOTE — Progress Notes (Signed)
05/23/12 1500  Clinical Encounter Type  Visited With Patient and family together  Visit Type Follow-up;Spiritual support;Social support  Spiritual Encounters  Spiritual Needs Emotional;Grief support  Stress Factors  Family Stress Factors (How to tell children about loss)    Followed up with Sonya Harrison between delivery of baby and D&E. Visited with husband afterward, providing him a chance to share some of his feelings and ask questions, like how to talk with his children and how to care for baby's body.  Provided Comfort information (chaplains as ongoing resources for support, Heartstrings details) and grief education (talking to children, dealing with incongruent grief, self-care, etc).  He opened up a bit and felt relieved to have that opportunity.  Stayed until baby's heartbeat was no longer audible, per RNs, but was still visible, giving him space to have private time with his son.  He was very Adult nurse.  [address] [place], [place] [phone]

## 2012-05-23 NOTE — Progress Notes (Signed)
Sat with pt and attempted to discuss what to expect, does not want to talk about things, very tearful. Still wants Neo to attend delivery. Gave funeral home list to FOB.

## 2012-05-23 NOTE — Progress Notes (Signed)
bp down into the 70' systolic once Neo discontinued, Dr Arby Barrette made aware, Neo initiated at 97mcg/min will continue to monitor and transfer to [facility]

## 2012-05-23 NOTE — Anesthesia Preprocedure Evaluation (Addendum)
Anesthesia Evaluation  Patient identified by MRN, date of birth, ID band Patient awake    Reviewed: Allergy & Precautions, H&P , Patient's Chart, lab work & pertinent test results  Airway Mallampati: II TM Distance: >3 FB Neck ROM: full    Dental No notable dental hx.    Pulmonary neg pulmonary ROS,  breath sounds clear to auscultation  Pulmonary exam normal       Cardiovascular negative cardio ROS  Rhythm:regular Rate:Normal     Neuro/Psych negative neurological ROS  negative psych ROS   GI/Hepatic negative GI ROS, Neg liver ROS,   Endo/Other  negative endocrine ROS  Renal/GU negative Renal ROS     Musculoskeletal   Abdominal   Peds  Hematology negative hematology ROS (+)   Anesthesia Other Findings Anemia     GERD (gastroesophageal reflux disease)   tums prn    Incompetent cervix Chorioamnionitis on ABX SVT transient non syncopal   Reproductive/Obstetrics (+) Pregnancy                           Anesthesia Physical  Anesthesia Plan  ASA: III and Emergent  Anesthesia Plan: Epidural   Post-op Pain Management:    Induction:   Airway Management Planned:   Additional Equipment:   Intra-op Plan:   Post-operative Plan:   Informed Consent: I have reviewed the patients History and Physical, chart, labs and discussed the procedure including the risks, benefits and alternatives for the proposed anesthesia with the patient or authorized representative who has indicated his/her understanding and acceptance.     Plan Discussed with: CRNA and Surgeon  Anesthesia Plan Comments: (!. Pt is coming for emergency D+C for retained placenta with epidural in place.)        Anesthesia Quick Evaluation

## 2012-05-23 NOTE — Progress Notes (Signed)
Tylenol for inc. temp

## 2012-05-23 NOTE — Progress Notes (Signed)
Increased bleeding this am, patient can feel gush of blood with cramps. Had sweats last night. Nurse noted about 150cc of clot this am.  Blood pressure 97/58, pulse 125, temperature 98.5 F (36.9 C), temperature source Oral, resp. rate 20, height 5\' 3"  (1.6 m), weight 71.215 kg (157 lb), last menstrual period 02/06/2012, SpO2 97.00%.  Results for orders placed during the hospital encounter of 05/12/12 (from the past 24 hour(s))  CBC     Status: Abnormal   Collection Time   05/22/12  7:00 PM      Component Value Range   WBC 18.5 (*) 4.0 - 10.5 K/uL   RBC 3.72 (*) 3.87 - 5.11 MIL/uL   Hemoglobin 10.0 (*) 12.0 - 15.0 g/dL   HCT 16.1 (*) 09.6 - 04.5 %   MCV 78.8  78.0 - 100.0 fL   MCH 26.9  26.0 - 34.0 pg   MCHC 34.1  30.0 - 36.0 g/dL   RDW 40.9  81.1 - 91.4 %   Platelets 304  150 - 400 K/uL  CREATININE, SERUM     Status: Abnormal   Collection Time   05/23/12  5:00 AM      Component Value Range   Creatinine, Ser 0.47 (*) 0.50 - 1.10 mg/dL   GFR calc non Af Amer >90  >90 mL/min   GFR calc Af Amer >90  >90 mL/min   Uterus NT  A: cervical insufficiency with stitch and BBOW     Now with increased bleeding     Last pm temp = 103 degrees and maternal tachycardia suspicious for chorioamnionitis  P: D/W patient and husband above       Recommend removing cerclage and allowing delivery      Reviewed previable status of fetus-patient states she understands-she requests neonatology at delivery      I D/W Dr Davonna Belling states he will come to delivery

## 2012-05-24 ENCOUNTER — Encounter: Payer: BC Managed Care – PPO | Admitting: Physical Therapy

## 2012-05-24 ENCOUNTER — Encounter (HOSPITAL_COMMUNITY): Payer: Self-pay | Admitting: Obstetrics and Gynecology

## 2012-05-24 MED ORDER — SODIUM CHLORIDE 0.9 % IJ SOLN
3.0000 mL | Freq: Two times a day (BID) | INTRAMUSCULAR | Status: DC
  Administered 2012-05-25 (×2): 3 mL via INTRAVENOUS

## 2012-05-24 MED ORDER — COMPLETENATE 29-1 MG PO CHEW
1.0000 | CHEWABLE_TABLET | Freq: Every day | ORAL | Status: DC
  Administered 2012-05-24 – 2012-05-25 (×2): 1 via ORAL
  Filled 2012-05-24 (×3): qty 1

## 2012-05-24 MED ORDER — SODIUM CHLORIDE 0.9 % IJ SOLN
3.0000 mL | INTRAMUSCULAR | Status: DC | PRN
  Administered 2012-05-24: 3 mL via INTRAVENOUS

## 2012-05-24 NOTE — Op Note (Signed)
[name], Harrison               ACCOUNT NO.:  [id number]  MEDICAL RECORD NO.:  [id number]  LOCATION:  9371                          FACILITY:  [facility]  PHYSICIAN:  Guy Sandifer. Henderson Cloud, M.D. DATE OF BIRTH:  03/22/1981  DATE OF PROCEDURE:  05/23/2012 DATE OF DISCHARGE:                              OPERATIVE REPORT   PREOPERATIVE DIAGNOSIS:  Retained placenta.  POSTOPERATIVE DIAGNOSIS:  Retained placenta.  PROCEDURE:  Dilatation and curettage.  SURGEON:  Guy Sandifer. Henderson Cloud, MD  ANESTHESIA:  Epidural, Quillian Quince, MD  ESTIMATED BLOOD LOSS:  350 mL, in the operating room.  SPECIMENS:  Placental tissue to Pathology.  INDICATIONS AND CONSENT:  This patient is a 31 year old G6, P3 at 74- 5/7th weeks who has been admitted to [facility] for 1-2 weeks.  She has a cervical insufficiency.  She had a cerclage placed early in pregnancy.  She then had dilation of the cervix and prolapse with the bag of waters into the vagina.  She has been managed as an inpatient for the last 1-2 weeks.  Over the last 12-24 hours, she has had increased bleeding and pelvic discomfort.  Early this morning, she had a temperature of 103.  She was also noted to have a pulse at that time in the 160s.  She has had episodes of sinus tachycardia in the past with an apparently benign cardiology evaluation.  After early hours of the morning progress, temperature was approximately 99-100.  Her white count was noted to go from approximately 13-18.  The increased bleeding persist and she had approximately 150 mL of clot cleaned by the nurse this morning.  The patient is now on triple antibiotics with ampicillin, gentamicin, clindamycin.  Due to the above factors and a suspicion of chorioamnionitis, removal of the cerclage and delivery is recommended. This was thoroughly discussed with the patient and her husband.  Earlier this morning, Dr. Eric Form from Neonatology also discussed and talked with the patient about the  extremely early gestational age, not being compatible with life.  The patient and her husband stated they clearly understood this as well as the indications for delivery.  The patient was then transferred to Labor and Delivery.  As the patient request, an epidural was placed.  During speculum examination, the bag of waters was noted to be prolapsed down to the vaginal introitus with feet in the bag.  While trying to remove the stitch, the bag of waters drained for foul-smelling blood-stained fluid.  The suture knot was then cut.  The baby immediately delivered at that point.  Cord was clamped and cut. The patient was observed for 30-40 minutes.  The placenta did not deliver.  She was given 2 mg of Versed IV while waiting at her request. While attempting to deliver the placenta, she was noted to have fever and a temperature of 104 was noted.  At that point, recommendation to go to the operating room for dilatation and curettage where the retained placenta was made.  This was discussed with the patient as well as her husband.  The procedure was reviewed.  Potential risks and complications including an infection, uterine perforation, organ damage, bleeding, and transfusion of blood  products with HIV and hepatitis acquisition, and intrauterine synechiae and secondary infertility and the need for subsequent surgery to repair this was reviewed.  All questions were answered and consent was signed on the chart.  PROCEDURE:  The patient was taken to the operating room where her epidural anesthetic was augmented to the surgical level.  She was placed in the dorsal lithotomy position.  Time-out was undertaken.  She was prepped with Hibiclens, bladder was straight catheterized and she was draped in a sterile fashion.  Curettage with a large banjo curette was then done with great care.  This was productive of placental tissue.  A 20 units of Pitocin per bag of IV fluids was then started and her  IV. Eventually, the cavity feels clean.  Ultrasound was brought into the operating room as well at the end of the case and ultrasound also was consistent with no remaining products of conception.  Excellent hemostasis was noted.  All counts were correct.  The patient was transferred to the recovery room in stable condition.     Guy Sandifer Henderson Cloud, M.D.     JET/MEDQ  D:  05/23/2012  T:  05/24/2012  Job:  [id number]

## 2012-05-24 NOTE — Anesthesia Postprocedure Evaluation (Signed)
  Anesthesia Post-op Note  Patient: Sonya Harrison  Procedure(s) Performed: Procedure(s) (LRB) with comments: DILATATION AND CURETTAGE (N/A)  Patient Location: ICU  Anesthesia Type: Epidural  Level of Consciousness: awake, alert  and oriented  Airway and Oxygen Therapy: Patient Spontanous Breathing  Post-op Pain: none  Post-op Assessment: Post-op Vital signs reviewed and Patient's Cardiovascular Status Stable  Post-op Vital Signs: Reviewed and stable  Complications: No apparent anesthesia complications

## 2012-05-24 NOTE — Progress Notes (Signed)
This was a follow-up visit with Sonya Harrison and her family.  She reported that she was doing better today both physically and emotionally.  Yesterday was very difficult, but she is able to process things a little better today.  She was appreciate of the support she received yesterday and of the follow-up visit today.  [employer] Pager, [phone] 1:00   05/24/12 1600  Clinical Encounter Type  Visited With Patient and family together  Visit Type Follow-up

## 2012-05-24 NOTE — Progress Notes (Signed)
Pt feeling well and requesting tx out of unit.   Tachycardia has improved with HR in low 100s c/w patients h/o SVT.  BP staple s/p Neo D/C'd.   Will tx to floor.  Mitchel Honour, DO

## 2012-05-24 NOTE — Addendum Note (Signed)
Addendum  created 05/24/12 0817 by Shanon Payor, CRNA   Modules edited:Charges VN, Notes Section

## 2012-05-24 NOTE — Progress Notes (Signed)
Post Partum Day 1 Subjective: No acute events overnight.  Abdominal pain significantly improved.  Pain and nausea well-controlled.  Foley in 2/2 Neo drip started by anesthesia.  Bleeding minimal.  Tolerating po well.    Objective: Blood pressure 95/55, pulse 95, temperature 98.1 F (36.7 C), temperature source Oral, resp. rate 24, height 5\' 3"  (1.6 m), weight 83.915 kg (185 lb), last menstrual period 02/06/2012, SpO2 98.00%, unknown if currently breastfeeding.Last fever 101 at 2115.  Tachycardia resolved overnight.    Physical Exam:  General: alert, cooperative and appears stated age Lochia: appropriate Uterine Fundus: firm, below umbilicus; moderate TTP but improved per pt.   Incision: n/a DVT Evaluation: No evidence of DVT seen on physical exam. SCDs on and functioning.   Basename 05/24/12 0555 05/23/12 1645  HGB 7.9* 6.1*  HCT 22.4* 17.6*    Assessment/Plan: 31yo [id number] PPD#1 s/p SVD @ 19w; chorio -Chorio: continue triples until AF x 24 hours.   -ABL anemia: s/p 2u PRBCs.  Hgb with appropriate rise and bleeding stable. -Tachycardia: improved with temperature control and transfusion; consider tx out of unit later today. -Hypotension: improved s/p 2u.  Anesthesia managing neo drip and attempting to wean.  Likely volume related rather than sepsis.  Baseline BP is low.   -Retained placenta: s/p D&C.     LOS: 12 days   Sonya Harrison 05/24/2012, 8:17 AM

## 2012-05-24 NOTE — Progress Notes (Signed)
UR chart review completed.  

## 2012-05-24 NOTE — Progress Notes (Signed)
Pt transferred ambulatory to rm #[id number] w/RN & family. Report to be given on receiving unit to Luisa Dago, RN

## 2012-05-25 LAB — CBC WITH DIFFERENTIAL/PLATELET
Hemoglobin: 7.2 g/dL — ABNORMAL LOW (ref 12.0–15.0)
Lymphocytes Relative: 16 % (ref 12–46)
Lymphs Abs: 2.8 10*3/uL (ref 0.7–4.0)
MCH: 28.3 pg (ref 26.0–34.0)
Monocytes Relative: 8 % (ref 3–12)
Neutro Abs: 12.5 10*3/uL — ABNORMAL HIGH (ref 1.7–7.7)
Neutrophils Relative %: 75 % (ref 43–77)
Platelets: 170 10*3/uL (ref 150–400)
RBC: 2.54 MIL/uL — ABNORMAL LOW (ref 3.87–5.11)
WBC: 16.8 10*3/uL — ABNORMAL HIGH (ref 4.0–10.5)

## 2012-05-25 LAB — PREPARE RBC (CROSSMATCH)

## 2012-05-25 MED ORDER — FUROSEMIDE 10 MG/ML IJ SOLN
20.0000 mg | Freq: Once | INTRAMUSCULAR | Status: AC
  Administered 2012-05-25: 20 mg via INTRAVENOUS
  Filled 2012-05-25: qty 2

## 2012-05-25 MED ORDER — DIPHENHYDRAMINE HCL 25 MG PO CAPS
25.0000 mg | ORAL_CAPSULE | Freq: Once | ORAL | Status: DC
  Filled 2012-05-25: qty 1

## 2012-05-25 MED ORDER — ACETAMINOPHEN 325 MG PO TABS
650.0000 mg | ORAL_TABLET | Freq: Once | ORAL | Status: AC
  Administered 2012-05-25: 650 mg via ORAL

## 2012-05-25 NOTE — Progress Notes (Signed)
Post Partum Day 2 Subjective: C/O light-headedness and weakness.  Pain well-controlled.  Improving abdominal pain.  No CP/SOB.  Bleeding lighter than a period.  Tolerating po and voiding without difficulty.    Objective: Blood pressure 103/70, pulse 122, temperature 98.2 F (36.8 C), temperature source Oral, resp. rate 18, height 5\' 3"  (1.6 m), weight 83.519 kg (184 lb 2 oz), last menstrual period 02/06/2012, SpO2 97.00%, unknown if currently breastfeeding.  Physical Exam:  General: alert, cooperative and appears stated age Lochia: appropriate Uterine Fundus: firm; less pain with palpation than yesterday. Incision: n/a DVT Evaluation: No evidence of DVT seen on physical exam. Negative Homan's sign. No cords or calf tenderness.   Basename 05/25/12 0527 05/24/12 0555  HGB 7.2* 7.9*  HCT 20.2* 22.4*    Assessment/Plan: Plan for discharge tomorrow Chorio: continue triple tx and D/C home with po tx.  AF x >24 hours. ABL anemia: Will tx an additional 2u PRBCs for symptomatic anemia. Tachycardia: stable.  Will likely improve with PRBCs.   LOS: 13 days   Jovaughn Wojtaszek 05/25/2012, 9:54 AM

## 2012-05-26 LAB — TYPE AND SCREEN
Antibody Screen: NEGATIVE
Unit division: 0
Unit division: 0
Unit division: 0
Unit division: 0

## 2012-05-26 LAB — CBC WITH DIFFERENTIAL/PLATELET
Band Neutrophils: 6 % (ref 0–10)
Blasts: 0 %
Eosinophils Absolute: 0.5 10*3/uL (ref 0.0–0.7)
Eosinophils Relative: 3 % (ref 0–5)
HCT: 28.1 % — ABNORMAL LOW (ref 36.0–46.0)
Hemoglobin: 9.9 g/dL — ABNORMAL LOW (ref 12.0–15.0)
Lymphocytes Relative: 23 % (ref 12–46)
Lymphs Abs: 3.8 10*3/uL (ref 0.7–4.0)
MCV: 80.1 fL (ref 78.0–100.0)
Metamyelocytes Relative: 0 %
Monocytes Absolute: 1.5 10*3/uL — ABNORMAL HIGH (ref 0.1–1.0)
Monocytes Relative: 9 % (ref 3–12)
RBC: 3.51 MIL/uL — ABNORMAL LOW (ref 3.87–5.11)
WBC: 16.4 10*3/uL — ABNORMAL HIGH (ref 4.0–10.5)

## 2012-05-26 MED ORDER — IBUPROFEN 600 MG PO TABS: 600.0000 mg | ORAL_TABLET | Freq: Four times a day (QID) | ORAL | Status: DC

## 2012-05-26 MED ORDER — FERROUS SULFATE 325 (65 FE) MG PO TABS: 325.0000 mg | ORAL_TABLET | Freq: Two times a day (BID) | ORAL | Status: DC

## 2012-05-26 MED ORDER — FERROUS SULFATE 325 (65 FE) MG PO TABS
325.0000 mg | ORAL_TABLET | Freq: Two times a day (BID) | ORAL | Status: DC
Start: 2012-05-26 — End: 2012-05-26

## 2012-05-26 MED ORDER — AMOXICILLIN-POT CLAVULANATE 500-125 MG PO TABS: 1.0000 | ORAL_TABLET | Freq: Two times a day (BID) | ORAL | Status: DC

## 2012-05-26 MED ORDER — OXYCODONE-ACETAMINOPHEN 5-325 MG PO TABS: 1.0000 | ORAL_TABLET | ORAL | Status: DC | PRN

## 2012-05-26 NOTE — Progress Notes (Signed)
Post Partum Day 3 Subjective: no complaints, up ad lib, voiding, tolerating PO and + flatus  Objective: Blood pressure 109/76, pulse 82, temperature 98.5 F (36.9 C), temperature source Oral, resp. rate 16, height 5\' 3"  (1.6 m), weight 84.936 kg (187 lb 4 oz), last menstrual period 02/06/2012, SpO2 98.00%, unknown if currently breastfeeding.  Physical Exam:  General: alert, cooperative and appears stated age Lochia: appropriate Uterine Fundus: firm Incision: n/a DVT Evaluation: No evidence of DVT seen on physical exam. Negative Homan's sign. No cords or calf tenderness. No significant calf/ankle edema.   Basename 05/26/12 0930 05/25/12 0527  HGB 9.9* 7.2*  HCT 28.1* 20.2*    Assessment/Plan: 31yo [id number] with CI PPD #3 s/p SVD of NVI at 19wks. Discharge home today Chorio: AF >48 hours; s/p tx with triples.  D/C home with po Augmentin x 5 days.   ABL anemia: s/p transfusion of 4u PRBCs with appropriate rise in Hgb.  Will d/c home with FeSO4.  Tachycardia: h/o SVT; improved with volume repletion.    LOS: 14 days   Kionna Brier 05/26/2012, 10:34 AM

## 2012-05-26 NOTE — Progress Notes (Signed)
Pt discharged to home via W/C, accompanied by significant other and NT. Discharge instructions given with understanding verbalized.

## 2012-05-26 NOTE — Discharge Summary (Signed)
Obstetric Discharge Summary Reason for Admission: Cervical incompetence Prenatal Procedures: cerclage Intrapartum Procedures: spontaneous vaginal delivery Postpartum Procedures: transfusion of 4u PRBCs and D&C Complications-Operative and Postpartum: Retained placenta Hemoglobin  Date Value Range Status  05/26/2012 9.9* 12.0 - 15.0 g/dL Final     DELTA CHECK NOTED     REPEATED TO VERIFY     HCT  Date Value Range Status  05/26/2012 28.1* 36.0 - 46.0 % Final    Physical Exam:  General: alert, cooperative and appears stated age Lochia: appropriate Uterine Fundus: firm Incision: n/a DVT Evaluation: No evidence of DVT seen on physical exam. Negative Homan's sign. No cords or calf tenderness. No significant calf/ankle edema.  Discharge Diagnoses: Incompetent cervix and chorioamnionitis  Discharge Information: Date: 05/26/2012 Activity: pelvic rest Diet: routine Medications: PNV, Ibuprofen, Colace, Iron and Percocet Condition: stable Instructions: refer to practice specific booklet Discharge to: home   Newborn Data: Live born female  Birth Weight: 12.4 oz (352 g) APGAR: 1, 1  Home with n/a.  Jayani Rozman 05/26/2012, 10:47 AM

## 2012-05-27 LAB — CBC
Hemoglobin: 7.9 g/dL — ABNORMAL LOW (ref 12.0–15.0)
MCH: 28 pg (ref 26.0–34.0)
MCHC: 35.3 g/dL (ref 30.0–36.0)

## 2012-05-27 NOTE — Anesthesia Postprocedure Evaluation (Signed)
Anesthesia Post Note  Patient: Sonya Harrison  Procedure(s) Performed: * No procedures listed *  Anesthesia type: Epidural  Patient location: Mother/Baby  Post pain: Pain level controlled  Post assessment: Post-op Vital signs reviewed  Last Vitals: There were no vitals filed for this visit.  Post vital signs: Reviewed  Level of consciousness: awake  Complications: No apparent anesthesia complications

## 2012-05-27 NOTE — Addendum Note (Signed)
Addendum  created 05/27/12 1217 by Sandrea Hughs., MD   Modules edited:Notes Section

## 2012-06-05 ENCOUNTER — Inpatient Hospital Stay (HOSPITAL_COMMUNITY)
Admission: AD | Admit: 2012-06-05 | Discharge: 2012-06-06 | Disposition: A | Discharge status: Home / Self Care | Payer: BC Managed Care – PPO | Source: Ambulatory Visit | Attending: Obstetrics & Gynecology | Admitting: Obstetrics & Gynecology

## 2012-06-05 NOTE — MAU Note (Signed)
WAS IN OFFICE YESTERDAY-   ALL OK.  NEXT APPOINTMENT  ON 06-18-2012.    SHE TOLD  THE NURSE  THAT BLEEDING HAD INCREASED-  DREW LABS AND DR HOLLAND  TOLD HER TO COME BACK IN 2 WEEKS.   CALLED ON CALL - TOLD TO COME TO HOSPITAL

## 2012-06-05 NOTE — MAU Note (Signed)
PT SAYS  SHE HAD SAB ON 05-23-2012- AND HAD D/C ON  10-10-   - SAYS HER BLEEDING HAS   GOTTEN WORSE  SINCE Friday.  STOPPED YESTERDAY-  STARTED AGAIN TODAY. PASSED CLOTS AT 6PM-   QUARTER   SIZE.   HAS PAD ON NOW-  IN TRIAGE-  SMALL AMT  - LIGHT RED-  SAYS IT HAS SLOWED DOWN .

## 2012-06-06 ENCOUNTER — Inpatient Hospital Stay (HOSPITAL_COMMUNITY): Payer: BC Managed Care – PPO

## 2012-06-06 ENCOUNTER — Encounter (HOSPITAL_COMMUNITY): Payer: Self-pay | Admitting: *Deleted

## 2012-06-06 LAB — CBC
HCT: 32.9 % — ABNORMAL LOW (ref 36.0–46.0)
MCHC: 33.4 g/dL (ref 30.0–36.0)
MCV: 84.1 fL (ref 78.0–100.0)
Platelets: 629 10*3/uL — ABNORMAL HIGH (ref 150–400)
RDW: 14.1 % (ref 11.5–15.5)
WBC: 11.9 10*3/uL — ABNORMAL HIGH (ref 4.0–10.5)

## 2012-06-06 NOTE — MAU Provider Note (Signed)
CC: No chief complaint on file.    First Provider Initiated Contact with Patient 06/06/12 0105      HPI Sonya Harrison is a 31 y.o. [id number]  who underwent D&C 05/23/12 for retained placenta after 19+wk cervical dilation through cerclage, chorio,and SVD with removal of stitch. For the first week, the  bleeding was about the same amount as at time of discharge, but began to get heaver 06-30-23 and has passed large clots yesterday. Uses several pads/day. No orthostatic sx. No tissue noted but has photos of large clots.   Past Medical History  Diagnosis Date  . Anemia   . GERD (gastroesophageal reflux disease)     tums prn  . Incompetent cervix     OB History    Grav Para Term Preterm Abortions TAB SAB Ect Mult Living   6 4 2 1 2 1 1  0 0 4     # Outc Date GA Lbr Len/2nd Wgt Sex Del Anes PTL Lv   1 PAR 10/13 [redacted]w[redacted]d 00:00 12.4oz(0.352kg) M SVD EPI  Yes   2 TRM            3 TRM            4 PRE  [redacted]w[redacted]d          5 SAB            6 TAB               Past Surgical History  Procedure Date  . Cesarean section 03/07, 04/09, 12/11  . Appendectomy 04/08/2011  . Cervical cerclage     x2  . Cervical cerclage 04/19/2012    Procedure: CERCLAGE CERVICAL;  Surgeon: [name], MD;  Location: [facility];  Service: Gynecology;  Laterality: N/A;  . Dilation and curettage of uterus 05/23/2012    Procedure: DILATATION AND CURETTAGE;  Surgeon: Leslie Andrea, MD;  Location: [facility];  Service: Gynecology;  Laterality: N/A;    History   Social History  . Marital Status: Married    Spouse Name: N/A    Number of Children: N/A  . Years of Education: N/A   Occupational History  . Not on file.   Social History Main Topics  . Smoking status: Never Smoker   . Smokeless tobacco: Never Used  . Alcohol Use: No  . Drug Use: No  . Sexually Active: Yes    Birth Control/ Protection: None     pregnant   Other Topics Concern  . Not on file   Social History Narrative  . No narrative on file     No current facility-administered medications on file prior to encounter.   Current Outpatient Prescriptions on File Prior to Encounter  Medication Sig Dispense Refill  . acetaminophen (TYLENOL) 325 MG tablet Take 650 mg by mouth every 6 (six) hours as needed.      Marland Kitchen amoxicillin-clavulanate (AUGMENTIN) 500-125 MG per tablet Take 1 tablet (500 mg total) by mouth 2 (two) times daily.  10 tablet  0  . ferrous sulfate 325 (65 FE) MG tablet Take 1 tablet (325 mg total) by mouth 2 (two) times daily with a meal.  60 tablet  2  . ibuprofen (ADVIL,MOTRIN) 600 MG tablet Take 1 tablet (600 mg total) by mouth every 6 (six) hours.  30 tablet  0  . oxyCODONE-acetaminophen (PERCOCET/ROXICET) 5-325 MG per tablet Take 1-2 tablets by mouth every 3 (three) hours as needed (moderate - severe pain).  20 tablet  0  . calcium carbonate (TUMS - DOSED IN MG ELEMENTAL CALCIUM) 500 MG chewable tablet Chew 2 tablets by mouth 3 (three) times daily as needed. For heartburn      . oxyCODONE-acetaminophen (PERCOCET/ROXICET) 5-325 MG per tablet Take 1 tablet by mouth once.      . Prenatal Vit-Fe Fumarate-FA (PRENATAL MULTIVITAMIN) TABS Take 1 tablet by mouth daily.      . progesterone (PROMETRIUM) 200 MG capsule Take 200 mg by mouth daily.      . promethazine (PHENERGAN) 25 MG tablet Take 25 mg by mouth every 6 (six) hours as needed.        Allergies  Allergen Reactions  . Contrast Media (Iodinated Diagnostic Agents) Hives    ROS Pertinent items in HPI  PHYSICAL EXAM General: Well nourished, well developed female in no acute distress Cardiovascular: Normal rate Respiratory: Normal effort Abdomen: Soft, nontender Back: No CVAT Extremities: No edema Neurologic: Alert and oriented  Speculum exam: NEFG; large 7 cm apparent clot x2 removed from cx with sponge forceps and scanty active bleeding after procedure blood; cervix clean Bimanual exam: cervix 1/long, no CMT; uterus slighltly enlarged  LAB RESULTS Results  for orders placed during the hospital encounter of 06/05/12 (from the past 24 hour(s))  CBC     Status: Abnormal   Collection Time   06/06/12  1:22 AM      Component Value Range   WBC 11.9 (*) 4.0 - 10.5 K/uL   RBC 3.91  3.87 - 5.11 MIL/uL   Hemoglobin 11.0 (*) 12.0 - 15.0 g/dL   HCT 16.1 (*) 09.6 - 04.5 %   MCV 84.1  78.0 - 100.0 fL   MCH 28.1  26.0 - 34.0 pg   MCHC 33.4  30.0 - 36.0 g/dL   RDW 40.9  81.1 - 91.4 %   Platelets 629 (*) 150 - 400 K/uL    IMAGING RADIOLOGY REPORT*  Clinical Data: Bleeding, recent D&C. History of  chorioamnionitis.  TRANSABDOMINAL ULTRASOUND OF PELVIS  Technique: Transabdominal ultrasound examination of the pelvis was  performed including evaluation of the uterus, ovaries, adnexal  regions, and pelvic cul-de-sac. The patient declined transvaginal  evaluation.  Comparison: 05/21/2012  Findings:  Uterus: Enlarged, in keeping with recent pregnancy, measuring up  to 13.3 x 5.8 cm.  Endometrium: Heterogeneous, within internal rounded area measuring  up to 1.8 x 1.3 cm. No hypervascularity of the endometrium or the  central rounded area. Note that ultrasound has limited sensitivity  for endometritis and cannot be excluded if the patient is febrile.  Right ovary: Normal appearance/no adnexal mass. Measures 2.6 x  1.9 x 2.7 cm.  Left ovary: Normal appearance/no adnexal mass. Measures 2.9 x  0.9 x 2.3 cm.  Other Findings: No free fluid  IMPRESSION:  1.8 x 1.3 cm focal heterogeneous area within the endometrium is  nonspecific however favored to reflect blood clot. Consider follow-  up ultrasound if symptoms persist.  Otherwise, as above.  Original Report Authenticated By: Waneta Martins, M.D.             External Result      ASSESSMENT 1. Excessive postpartum bleeding     PLAN Pt declines methergine and favors expectant management. Discharge home with F/U in office as scheduled. See AVS for patient education   Medication List       As of 06/06/2012  1:13 AM    ASK your doctor about these medications         acetaminophen  325 MG tablet   Commonly known as: TYLENOL   Take 650 mg by mouth every 6 (six) hours as needed.      amoxicillin-clavulanate 500-125 MG per tablet   Commonly known as: AUGMENTIN   Take 1 tablet (500 mg total) by mouth 2 (two) times daily.      calcium carbonate 500 MG chewable tablet   Commonly known as: TUMS - dosed in mg elemental calcium   Chew 2 tablets by mouth 3 (three) times daily as needed. For heartburn      ferrous sulfate 325 (65 FE) MG tablet   Take 1 tablet (325 mg total) by mouth 2 (two) times daily with a meal.      ibuprofen 600 MG tablet   Commonly known as: ADVIL,MOTRIN   Take 1 tablet (600 mg total) by mouth every 6 (six) hours.      oxyCODONE-acetaminophen 5-325 MG per tablet   Commonly known as: PERCOCET/ROXICET   Take 1-2 tablets by mouth every 3 (three) hours as needed (moderate - severe pain).      oxyCODONE-acetaminophen 5-325 MG per tablet   Commonly known as: PERCOCET/ROXICET   Take 1 tablet by mouth once.      prenatal multivitamin Tabs   Take 1 tablet by mouth daily.      progesterone 200 MG capsule   Commonly known as: PROMETRIUM   Take 200 mg by mouth daily.      promethazine 25 MG tablet   Commonly known as: PHENERGAN   Take 25 mg by mouth every 6 (six) hours as needed.         Danae Orleans, CNM 06/06/2012 1:13 AM

## 2012-07-02 ENCOUNTER — Ambulatory Visit (INDEPENDENT_AMBULATORY_CARE_PROVIDER_SITE_OTHER): Payer: BC Managed Care – PPO | Admitting: Family Medicine

## 2012-07-02 ENCOUNTER — Ambulatory Visit: Payer: No Typology Code available for payment source

## 2012-07-02 VITALS — BP 116/74 | HR 85 | Temp 98.2°F | Resp 16 | Ht 63.5 in | Wt 174.0 lb

## 2012-07-02 DIAGNOSIS — M542 Cervicalgia: Secondary | ICD-10-CM

## 2012-07-02 DIAGNOSIS — D649 Anemia, unspecified: Secondary | ICD-10-CM

## 2012-07-02 DIAGNOSIS — M549 Dorsalgia, unspecified: Secondary | ICD-10-CM

## 2012-07-02 DIAGNOSIS — F4321 Adjustment disorder with depressed mood: Secondary | ICD-10-CM

## 2012-07-02 LAB — POCT CBC
Granulocyte percent: 58.3 %G (ref 37–80)
HCT, POC: 38.3 % (ref 37.7–47.9)
Hemoglobin: 11.6 g/dL — AB (ref 12.2–16.2)
Lymph, poc: 2.8 (ref 0.6–3.4)
MCV: 87.4 fL (ref 80–97)
POC Granulocyte: 4.5 (ref 2–6.9)
POC LYMPH PERCENT: 35.9 %L (ref 10–50)

## 2012-07-02 MED ORDER — CYCLOBENZAPRINE HCL 10 MG PO TABS: 10.0000 mg | ORAL_TABLET | Freq: Every evening | ORAL | Status: DC | PRN

## 2012-07-02 NOTE — Patient Instructions (Addendum)
Call me if you decide you would like to talk to a counselor or have any other treatment.  I will refer you to PT.  Take care of yourself
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
## 2012-07-08 ENCOUNTER — Ambulatory Visit: Payer: No Typology Code available for payment source | Attending: Internal Medicine

## 2012-07-08 DIAGNOSIS — M2550 Pain in unspecified joint: Secondary | ICD-10-CM | POA: Insufficient documentation

## 2012-07-08 DIAGNOSIS — IMO0001 Reserved for inherently not codable concepts without codable children: Secondary | ICD-10-CM | POA: Insufficient documentation

## 2012-07-08 DIAGNOSIS — R293 Abnormal posture: Secondary | ICD-10-CM | POA: Insufficient documentation

## 2012-07-08 DIAGNOSIS — M2560 Stiffness of unspecified joint, not elsewhere classified: Secondary | ICD-10-CM | POA: Insufficient documentation

## 2012-07-16 ENCOUNTER — Encounter: Payer: BC Managed Care – PPO | Admitting: Rehabilitation

## 2012-07-18 ENCOUNTER — Ambulatory Visit: Payer: No Typology Code available for payment source | Attending: Internal Medicine

## 2012-07-18 DIAGNOSIS — M2550 Pain in unspecified joint: Secondary | ICD-10-CM | POA: Insufficient documentation

## 2012-07-18 DIAGNOSIS — IMO0001 Reserved for inherently not codable concepts without codable children: Secondary | ICD-10-CM | POA: Insufficient documentation

## 2012-07-18 DIAGNOSIS — R293 Abnormal posture: Secondary | ICD-10-CM | POA: Insufficient documentation

## 2012-07-18 DIAGNOSIS — M2560 Stiffness of unspecified joint, not elsewhere classified: Secondary | ICD-10-CM | POA: Insufficient documentation

## 2012-07-19 ENCOUNTER — Ambulatory Visit: Payer: No Typology Code available for payment source | Admitting: Rehabilitation

## 2012-07-25 ENCOUNTER — Ambulatory Visit: Payer: No Typology Code available for payment source

## 2012-07-30 ENCOUNTER — Ambulatory Visit: Payer: No Typology Code available for payment source | Admitting: Rehabilitation

## 2012-08-02 ENCOUNTER — Ambulatory Visit: Payer: No Typology Code available for payment source | Admitting: Rehabilitation

## 2012-08-05 ENCOUNTER — Ambulatory Visit: Payer: No Typology Code available for payment source | Admitting: Rehabilitation

## 2012-08-08 ENCOUNTER — Ambulatory Visit: Payer: No Typology Code available for payment source

## 2012-08-13 ENCOUNTER — Ambulatory Visit: Payer: No Typology Code available for payment source

## 2012-08-14 DIAGNOSIS — Z0271 Encounter for disability determination: Secondary | ICD-10-CM

## 2012-08-19 ENCOUNTER — Ambulatory Visit: Payer: No Typology Code available for payment source | Attending: Internal Medicine

## 2012-08-19 DIAGNOSIS — M2560 Stiffness of unspecified joint, not elsewhere classified: Secondary | ICD-10-CM | POA: Insufficient documentation

## 2012-08-19 DIAGNOSIS — R293 Abnormal posture: Secondary | ICD-10-CM | POA: Insufficient documentation

## 2012-08-19 DIAGNOSIS — M2550 Pain in unspecified joint: Secondary | ICD-10-CM | POA: Insufficient documentation

## 2012-08-19 DIAGNOSIS — IMO0001 Reserved for inherently not codable concepts without codable children: Secondary | ICD-10-CM | POA: Insufficient documentation

## 2012-08-22 ENCOUNTER — Ambulatory Visit: Payer: No Typology Code available for payment source | Admitting: Rehabilitation

## 2012-08-27 ENCOUNTER — Ambulatory Visit: Payer: No Typology Code available for payment source | Admitting: Rehabilitation

## 2012-08-29 ENCOUNTER — Ambulatory Visit: Payer: No Typology Code available for payment source | Admitting: Rehabilitation

## 2012-09-18 ENCOUNTER — Emergency Department (INDEPENDENT_AMBULATORY_CARE_PROVIDER_SITE_OTHER)
Admission: EM | Admit: 2012-09-18 | Discharge: 2012-09-18 | Disposition: A | Discharge status: Home / Self Care | Payer: BC Managed Care – PPO | Source: Home / Self Care | Attending: Family Medicine | Admitting: Family Medicine

## 2012-09-18 ENCOUNTER — Encounter (HOSPITAL_COMMUNITY): Payer: Self-pay | Admitting: Emergency Medicine

## 2012-09-18 ENCOUNTER — Ambulatory Visit (HOSPITAL_COMMUNITY)
Admit: 2012-09-18 | Discharge: 2012-09-18 | Disposition: A | Discharge status: Home / Self Care | Payer: BC Managed Care – PPO | Attending: Family Medicine | Admitting: Family Medicine

## 2012-09-18 DIAGNOSIS — M79609 Pain in unspecified limb: Secondary | ICD-10-CM

## 2012-09-18 DIAGNOSIS — M76899 Other specified enthesopathies of unspecified lower limb, excluding foot: Secondary | ICD-10-CM

## 2012-09-18 DIAGNOSIS — M6580 Other synovitis and tenosynovitis, unspecified site: Secondary | ICD-10-CM

## 2012-09-18 LAB — POCT I-STAT, CHEM 8
Creatinine, Ser: 0.9 mg/dL (ref 0.50–1.10)
HCT: 39 % (ref 36.0–46.0)
Hemoglobin: 13.3 g/dL (ref 12.0–15.0)
Potassium: 3.9 mEq/L (ref 3.5–5.1)
Sodium: 142 mEq/L (ref 135–145)
TCO2: 28 mmol/L (ref 0–100)

## 2012-09-18 MED ORDER — IBUPROFEN 600 MG PO TABS: 600.0000 mg | ORAL_TABLET | Freq: Three times a day (TID) | ORAL | Status: DC | PRN

## 2012-09-18 MED ORDER — TRAMADOL HCL 50 MG PO TABS: 50.0000 mg | ORAL_TABLET | Freq: Four times a day (QID) | ORAL | Status: DC | PRN

## 2012-09-18 MED ORDER — HYDROCODONE-ACETAMINOPHEN 5-325 MG PO TABS: 2.0000 | ORAL_TABLET | ORAL | Status: DC | PRN

## 2012-09-18 NOTE — ED Provider Notes (Signed)
History     CSN: [id number]  Arrival date & time 09/18/12  1058   First MD Initiated Contact with Patient 09/18/12 1154      Chief Complaint  Patient presents with  . Leg Pain    (Consider location/radiation/quality/duration/timing/severity/associated sxs/prior treatment) HPI Comments: 32 year old female with history of chronic iron deficiency anemia, and recent pregnancy loss at 19 weeks on October 2013. Here complaining of left leg pain for 2 days. Patient reports pain in the back of her left thigh mostly above posterior left knee, associated with focal swelling at the back of the left knee. Symptoms present for 2 days. Reports pain is worse with flexion of her lower leg. 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 She is concerned for a DVT. Patient is not taking  oral contraceptives for any hormone medications. No prior history of DVTs. No family history of coagulopathies. No recent immobilization or traveling. Denies chest pain, shortness of breath or difficulty breathing.  Patient has a history of anemia I would like to have her hemoglobin and rechecked. She reports inconsistent in iron supplementation seems is the was prescribed over 2 months ago. She currently has a cup her IUD and reports heavy periods.    Past Medical History  Diagnosis Date  . Anemia   . GERD (gastroesophageal reflux disease)     tums prn  . Incompetent cervix     Past Surgical History  Procedure Date  . Cesarean section 03/07, 04/09, 12/11  . Appendectomy 04/08/2011  . Cervical cerclage     x2  . Cervical cerclage 04/19/2012    Procedure: CERCLAGE CERVICAL;  Surgeon: [name], MD;  Location: [facility];  Service: Gynecology;  Laterality: N/A;  . Dilation and curettage of uterus 05/23/2012    Procedure: DILATATION AND CURETTAGE;  Surgeon: Leslie Andrea, MD;  Location: [facility];  Service:  Gynecology;  Laterality: N/A;    No family history on file.  History  Substance Use Topics  . Smoking status: Never Smoker   . Smokeless tobacco: Never Used  . Alcohol Use: No    OB History    Grav Para Term Preterm Abortions TAB SAB Ect Mult Living   6 4 2 1 2 1 1  0 0 4      Review of Systems  Constitutional: Negative for fever and chills.  Respiratory: Negative for cough and shortness of breath.   Cardiovascular: Negative for chest pain, palpitations and leg swelling.  Gastrointestinal: Negative for nausea, vomiting and abdominal pain.  Musculoskeletal:       As per history of present illness  Skin: Negative for rash.  Neurological: Negative for dizziness and headaches.  All other systems reviewed and are negative.    Allergies  Contrast media  Home Medications   Current Outpatient Rx  Name  Route  Sig  Dispense  Refill  . ACETAMINOPHEN 325 MG PO TABS   Oral   Take 650 mg by mouth every 6 (six) hours as needed.         Marland Kitchen CALCIUM CARBONATE ANTACID 500 MG PO CHEW   Oral   Chew 2 tablets by mouth 3 (three) times daily as needed. For heartburn         . CYCLOBENZAPRINE HCL 10 MG PO TABS   Oral   Take 1 tablet (10 mg total) by mouth at bedtime  as needed for muscle spasms.   30 tablet   0   . FERROUS SULFATE 325 (65 FE) MG PO TABS   Oral   Take 1 tablet (325 mg total) by mouth 2 (two) times daily with a meal.   60 tablet   2   . HYDROCODONE-ACETAMINOPHEN 5-325 MG PO TABS   Oral   Take 2 tablets by mouth every 4 (four) hours as needed for pain.   6 tablet   0   . IBUPROFEN 600 MG PO TABS   Oral   Take 1 tablet (600 mg total) by mouth every 8 (eight) hours as needed for pain. Take with food and plenty of fluids   30 tablet   0   . PRENATAL MULTIVITAMIN CH   Oral   Take 1 tablet by mouth daily.         . TRAMADOL HCL 50 MG PO TABS   Oral   Take 1 tablet (50 mg total) by mouth every 6 (six) hours as needed for pain.   15 tablet   0      BP 121/82  Pulse 105  Temp 98.9 F (37.2 C) (Oral)  Resp 18  Ht 5\' 4"  (1.626 m)  Wt 180 lb (81.647 kg)  BMI 30.90 kg/m2  SpO2 100%  Breastfeeding? No  Physical Exam  Nursing note and vitals reviewed. Constitutional: She is oriented to person, place, and time. She appears well-developed and well-nourished. No distress.  HENT:  Head: Normocephalic and atraumatic.  Eyes: Conjunctivae normal are normal.  Neck: No JVD present.  Cardiovascular: Regular rhythm, normal heart sounds and intact distal pulses.  Exam reveals no gallop and no friction rub.   No murmur heard. Pulmonary/Chest: Effort normal and breath sounds normal. No respiratory distress. She has no wheezes. She has no rales. She exhibits no tenderness.  Abdominal: Soft. There is no tenderness.  Musculoskeletal:       Left knee: No obvious deformity or effusion. No anterior or pain with palpation. No laxity. Negative drawer test. Negative McMurray. Left thigh: There is tenderness to palpation at the insertion of the hamstring muscle just above the popliteal area associated with mild swelling; there is also tenderness to palpation along hamstring muscles medially. Pain is worse with a lower leg flexion against resistance. No significant weakness of the hamstring. Popliteal pulses are present and normal. I do not feel a Baker's cyst. Entire left lower extremity is warm and similar compared to right. No distal cyanosis. Fast capillary refill at toe pads. Left lower extremity impress neurovascularly normal. No calf swelling compared with the right or tenderness. No Homan's sign.  Neurological: She is alert and oriented to person, place, and time.  Skin: No rash noted. She is not diaphoretic.    ED Course  Procedures (including critical care time)  Labs Reviewed  POCT I-STAT, CHEM 8 - Abnormal; Notable for the following:    Calcium, Ion 1.25 (*)     All other components within normal limits   No results found.   1.  Hamstring tendonitis at origin       MDM  Impress hamstring tendinitis.  Lungs are clear no respiratory symptoms, Unlikely DVT process. Applied Ace wrap.  Point-of-care hemoglobin is 13.3. Ordered a venous Doppler ultrasound also will help rule out days Baker's cyst. Prescribed hydrocodone/acetaminophen, ibuprofen and tramadol. Supportive care including rehabilitation exercises discussed with patient and provided in writing. Sports medicine referral as needed.  Note I was called by vascular  lab informing that patient's venous Doppler ultrasound is normal with patent venous vessels and no evidence of Baker's cyst. Patient aware of these results.         Sharin Grave, MD 09/19/12 1730

## 2012-09-18 NOTE — Progress Notes (Signed)
VASCULAR LAB PRELIMINARY  PRELIMINARY  PRELIMINARY  PRELIMINARY  Left lower extremity venous duplex completed.    Preliminary report:  Left:  No evidence of DVT, superficial thrombosis, or Baker's cyst.  Deanglo Hissong, RVS 09/18/2012, 4:35 PM

## 2012-09-18 NOTE — ED Notes (Addendum)
Pt c/o pain posterior to left knee x2 days.  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   She is alert w/no signs of acute distress Steady gait w/a limp

## 2012-09-18 NOTE — ED Notes (Signed)
Called cardiovascular and spoke w/Cindy... Has appt today at 1500... Needs to report to admitting at [facility].. Pt verbalized understanding.

## 2013-02-12 IMAGING — US US OB LIMITED
1 series · 12 of 28 positions shown · non-contrast
Comparison: none

OBSTETRICS REPORT
                      (Signed Final 05/16/2012 [DATE])

Service(s) Provided
 [HOSPITAL]                                         76815.0
Indications
 Cervical insufficiency (S/P cerclage)
 Poor obstetric history: Previous midtrimester loss
 (20 week PROM)
 Poor obstetric history: Previous preterm delivery
 (30 weeks)
 Poor obstetrical history (abruption)
 Previous cesarean section
 Anemia
 Assess cervical length
Fetal Evaluation
 Num Of Fetuses:    1
 Fetal Heart Rate:  145                          bpm
 Cardiac Activity:  Observed
 Presentation:      Cephalic
 Placenta:          Fundal right, above
                    cervical os
 P. Cord            Visualized, central
 Insertion:
 Amniotic Fluid
 AFI FV:      Subjectively within normal limits
                                             Larg Pckt:     6.4  cm
Gestational Age
 LMP:           14w 2d        Date:  02/06/12                 EDD:   11/12/12
 Clinical EDD:  18w 5d                                        EDD:   10/12/12
 Best:          18w 5d     Det. By:  Clinical EDD             EDD:   10/12/12
Cervix Uterus Adnexa
 Cervical Length:    0        cm
 Cervix:       Hourglass membranes into the vagina. Cerclage
               visualized.
 Left Ovary:    Within normal limits.
 Right Ovary:   Not visualized.
 Adnexa:     No abnormality visualized.
Impression
INDICATION: 31 yr old 259GJGO at 77w7d with cervical
 insufficiency, history of preterm delivery, and now with
 previable cervical dilation and prolapsing membranes; s/p
 prophylactic cerclage.

[Series 1: us ob limited · 12 of 31 slices shown]
[im 2/31]
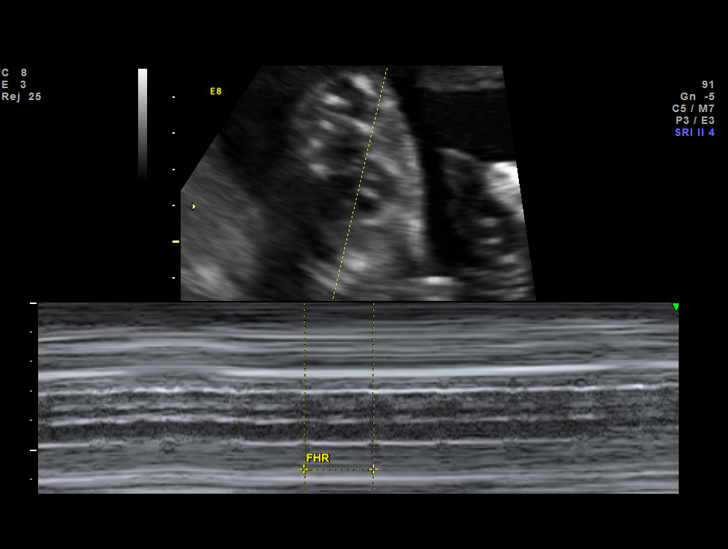
[im 4/31]
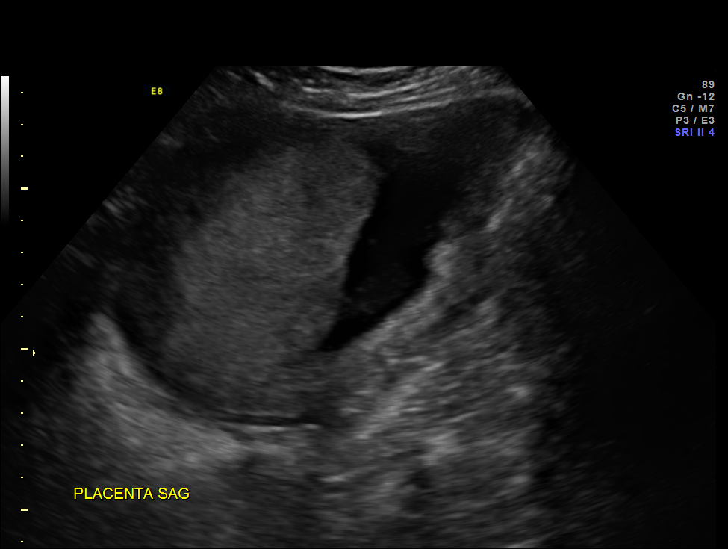
[im 6/31]
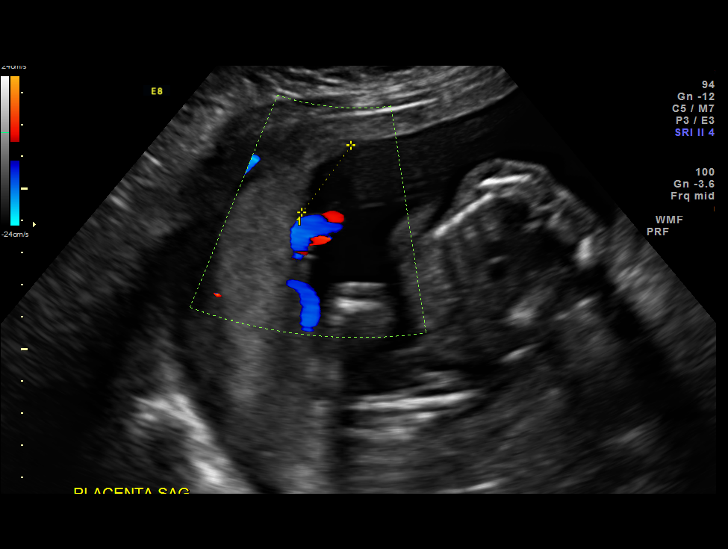
[im 9/31]
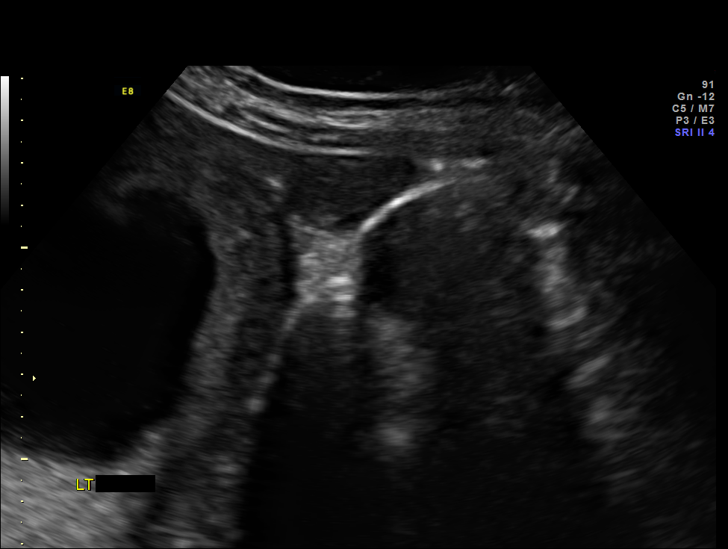
[im 12/31]
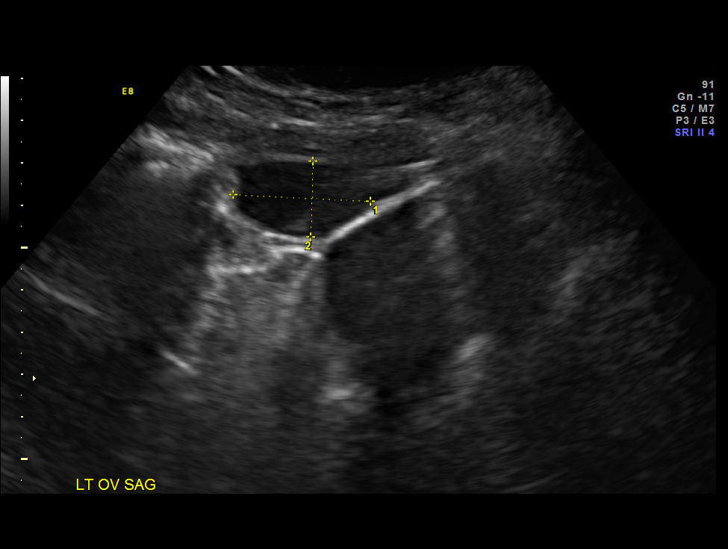
[im 14/31]
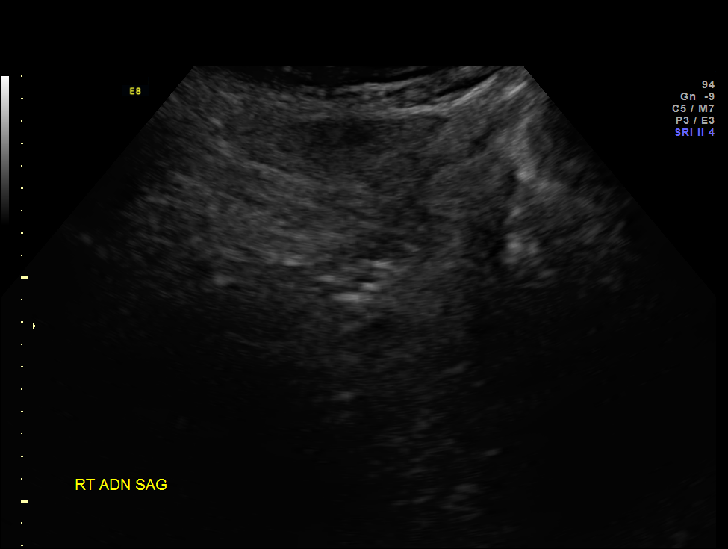
[im 17/31]
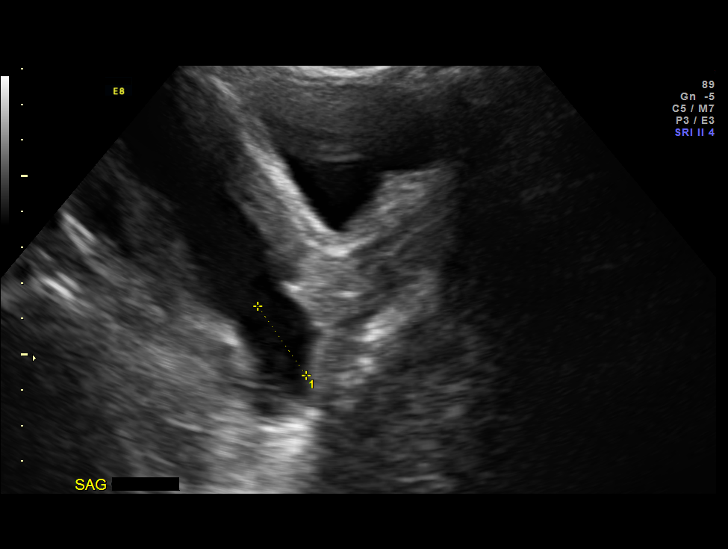
[im 19/31]
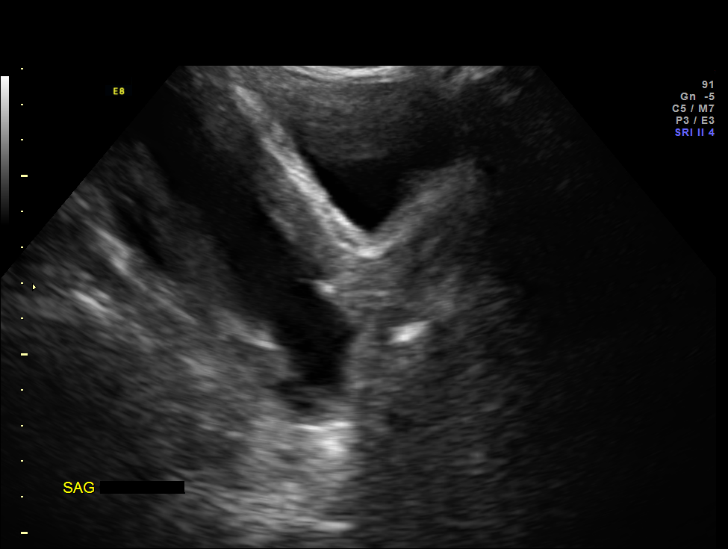
[im 22/31]
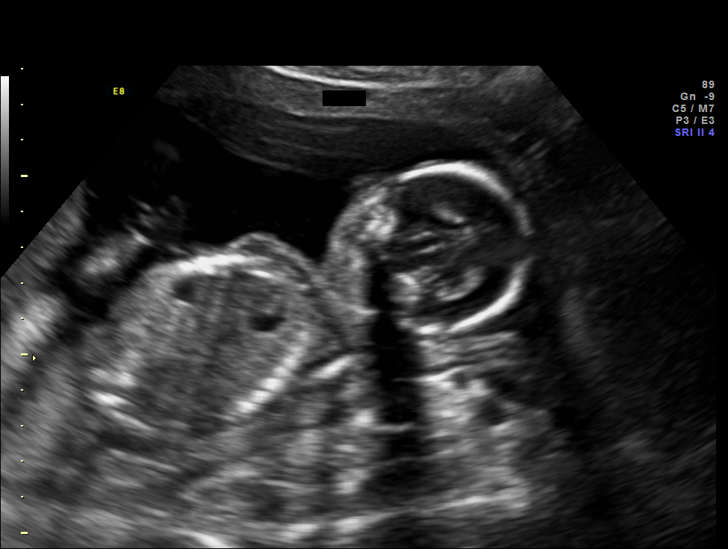
[im 25/31]
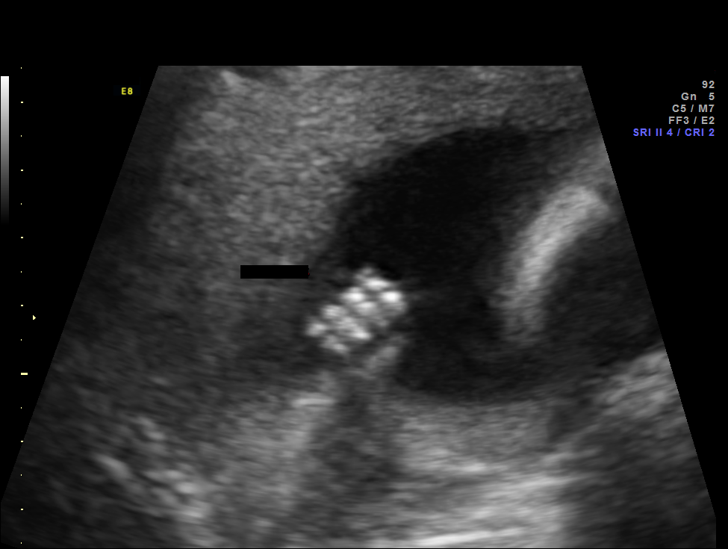
[im 27/31]
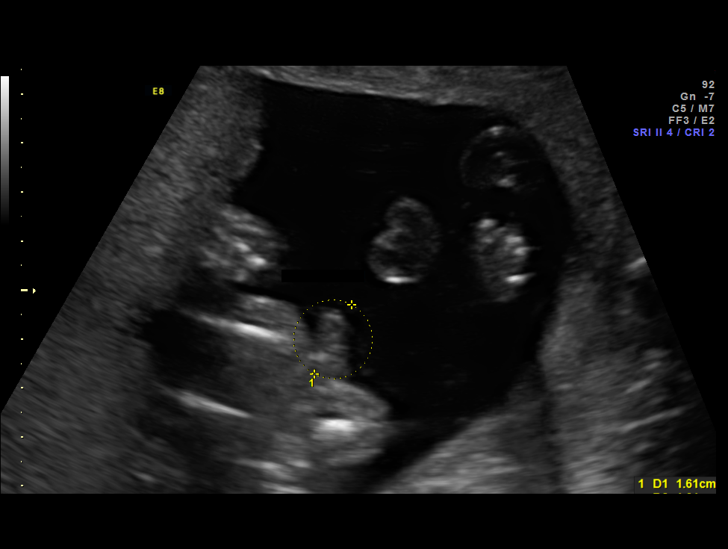
[im 29/31]
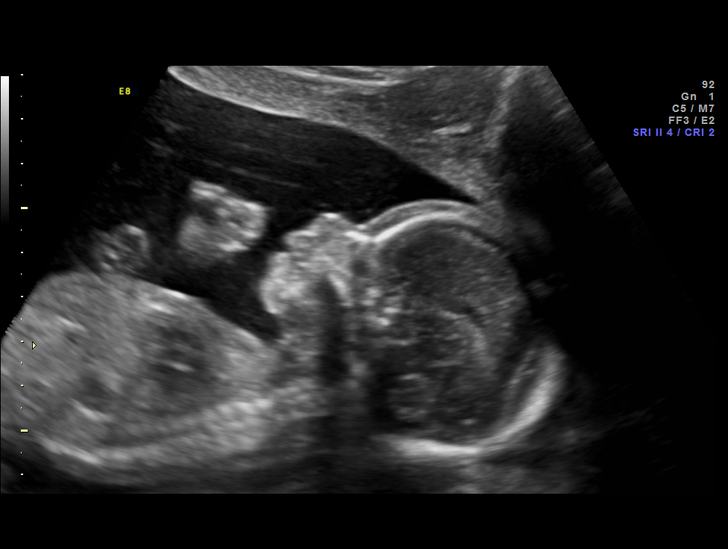

[12 of 28 positions shown; findings below may reference images not displayed]

FINDINGS: 1. Normal fetal heart rate.
 2. Fundal placenta without evidence of previa.
 3. Normal amniotic fluid volume.
 4. The cervical length overall looks normal; however the
 cervix is dilated 2cm throughout. The cerclage is in place.
 The membranes are seen protruding through the external
 cervical os.
Recommendations

 1. Cervical insufficiency: previously counseled. As previously
 counseled by Dr. Nomasibulele Moatshe agree that a reattempt at
 cerclage is likely not going to help and may worsen outcomes
 and therefore is not recommended. Would recommend
 continuing vaginal progesterone and bedrest; although there
 is no evidence to suggest that trendelenburg will improve
 outcomes therefore may consider discontinuing
 trendelenburg. Discussed that using the toilet and shower
 also has not been shown to worsen outcomes and that may
 be considered as well. Discussed increased risk for PPROM,
 preterm or previable delivery, and infection. Patient is aware.
 At this point do not feel there are any other interventions to
 offer until closer to viability; then would recommend
 [place] and possible inpatient management.
 Outpatient management can be considered at this time-
 patient should be on bedrest and check temperature 1-2x/day
 to monitor closely for signs/symptoms of chorioamnionitis.
 Also recommend frequent outpatient visits to monitor for
 signs/symptoms of PPROM, preterm labor, and
 chorioamnionitis.

 2. As patient will have prolonged bed rest in this pregnancy
 would recommend some form of DVT prophylaxis- patient
 currently is on SCDs.
 3. Would recommend an anatomy survey if not already
 performed.
 4. Do not recommend any further transvaginal cervical
 lengths at this time as this may increase the risk for infection
 and is not likely to change management at this point.
 5. If diagnosed with chorioamnionitis would recommend
 delivery.

 questions or concerns.

## 2013-02-18 ENCOUNTER — Telehealth: Payer: Self-pay | Admitting: Family Medicine

## 2013-02-18 ENCOUNTER — Ambulatory Visit (INDEPENDENT_AMBULATORY_CARE_PROVIDER_SITE_OTHER): Payer: BC Managed Care – PPO | Admitting: Family Medicine

## 2013-02-18 VITALS — BP 100/60 | HR 75 | Temp 97.9°F | Resp 12 | Ht 64.0 in | Wt 171.8 lb

## 2013-02-18 DIAGNOSIS — F40298 Other specified phobia: Secondary | ICD-10-CM

## 2013-02-18 DIAGNOSIS — F40243 Fear of flying: Secondary | ICD-10-CM

## 2013-02-18 DIAGNOSIS — B360 Pityriasis versicolor: Secondary | ICD-10-CM

## 2013-02-18 MED ORDER — ITRACONAZOLE 200 MG PO TABS: 400.0000 mg | ORAL_TABLET | Freq: Every day | ORAL | Status: DC

## 2013-02-18 MED ORDER — FLUCONAZOLE 200 MG PO TABS: 400.0000 mg | ORAL_TABLET | Freq: Every day | ORAL | Status: DC

## 2013-02-18 MED ORDER — ALPRAZOLAM 0.5 MG PO TABS: ORAL_TABLET | ORAL | Status: DC
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
## 2013-02-18 NOTE — Telephone Encounter (Signed)
Cancelled Itraconazole, replaced with flucanazole per Dr. Cyndie Chime request.

## 2013-02-18 NOTE — Patient Instructions (Addendum)
Use the fluconazole for your tinea versicolor.  Ketoconazole is no longer recommended for tinea versicolor due to potential liver damage.   Do not combine the fluconazole with your xanax.

## 2013-06-25 ENCOUNTER — Ambulatory Visit (INDEPENDENT_AMBULATORY_CARE_PROVIDER_SITE_OTHER): Payer: BC Managed Care – PPO | Admitting: Emergency Medicine

## 2013-06-25 VITALS — BP 130/84 | HR 89 | Temp 98.0°F | Resp 16 | Ht 63.0 in | Wt 179.0 lb

## 2013-06-25 DIAGNOSIS — IMO0002 Reserved for concepts with insufficient information to code with codable children: Secondary | ICD-10-CM

## 2013-06-25 DIAGNOSIS — S46812S Strain of other muscles, fascia and tendons at shoulder and upper arm level, left arm, sequela: Secondary | ICD-10-CM

## 2013-06-25 DIAGNOSIS — H9202 Otalgia, left ear: Secondary | ICD-10-CM

## 2013-06-25 DIAGNOSIS — H9209 Otalgia, unspecified ear: Secondary | ICD-10-CM

## 2013-06-25 MED ORDER — FLUTICASONE PROPIONATE 50 MCG/ACT NA SUSP: 2.0000 | Freq: Every day | NASAL | Status: DC

## 2013-06-25 MED ORDER — CYCLOBENZAPRINE HCL 10 MG PO TABS
10.0000 mg | ORAL_TABLET | Freq: Three times a day (TID) | ORAL | Status: DC | PRN
Start: 2013-06-25 — End: 2013-08-27

## 2013-06-25 MED ORDER — NAPROXEN SODIUM 550 MG PO TABS: 550.0000 mg | ORAL_TABLET | Freq: Two times a day (BID) | ORAL | Status: DC

## 2013-06-25 NOTE — Addendum Note (Signed)
Addended by: Carmelina Dane on: 06/25/2013 09:40 AM   Modules accepted: Orders
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
## 2013-08-27 ENCOUNTER — Emergency Department (HOSPITAL_COMMUNITY)
Admission: EM | Admit: 2013-08-27 | Discharge: 2013-08-28 | Disposition: A | Discharge status: Home / Self Care | Payer: BC Managed Care – PPO | Attending: Emergency Medicine | Admitting: Emergency Medicine

## 2013-08-27 ENCOUNTER — Encounter (HOSPITAL_COMMUNITY): Payer: Self-pay | Admitting: Emergency Medicine

## 2013-08-27 DIAGNOSIS — Z792 Long term (current) use of antibiotics: Secondary | ICD-10-CM | POA: Insufficient documentation

## 2013-08-27 DIAGNOSIS — Z8719 Personal history of other diseases of the digestive system: Secondary | ICD-10-CM | POA: Insufficient documentation

## 2013-08-27 DIAGNOSIS — IMO0001 Reserved for inherently not codable concepts without codable children: Secondary | ICD-10-CM | POA: Insufficient documentation

## 2013-08-27 DIAGNOSIS — J111 Influenza due to unidentified influenza virus with other respiratory manifestations: Secondary | ICD-10-CM

## 2013-08-27 DIAGNOSIS — Z8742 Personal history of other diseases of the female genital tract: Secondary | ICD-10-CM | POA: Insufficient documentation

## 2013-08-27 DIAGNOSIS — R000 Tachycardia, unspecified: Secondary | ICD-10-CM | POA: Insufficient documentation

## 2013-08-27 DIAGNOSIS — Z862 Personal history of diseases of the blood and blood-forming organs and certain disorders involving the immune mechanism: Secondary | ICD-10-CM | POA: Insufficient documentation

## 2013-08-27 MED ORDER — SODIUM CHLORIDE 0.9 % IV BOLUS (SEPSIS)
1000.0000 mL | Freq: Once | INTRAVENOUS | Status: AC
  Administered 2013-08-27: 1000 mL via INTRAVENOUS

## 2013-08-27 MED ORDER — ACETAMINOPHEN 325 MG PO TABS
650.0000 mg | ORAL_TABLET | Freq: Once | ORAL | Status: AC
  Administered 2013-08-27: 650 mg via ORAL
  Filled 2013-08-27: qty 2

## 2013-08-27 NOTE — ED Provider Notes (Signed)
CSN: [id number]     Arrival date & time 08/27/13  2026 History   First MD Initiated Contact with Patient 08/27/13 2226     Chief Complaint  Patient presents with  . URI   (Consider location/radiation/quality/duration/timing/severity/associated sxs/prior Treatment) HPI Comments: Patient with a history of UTI.  He started taking antibiotic yesterday after she was seen at the urgent care clinic.  She then developed, rhinitis, for myalgias, but she'll no change in appetite, no nausea, or diarrhea. She, states she's been eating, and drinking well, has not taken any other medication besides Macrodantin  Patient is a 33 y.o. female presenting with URI. The history is provided by the patient.  URI Presenting symptoms: congestion, cough, rhinorrhea and sore throat   Presenting symptoms: no fever   Congestion:    Location:  Nasal Cough:    Cough characteristics:  Non-productive   Sputum characteristics:  Nondescript   Severity:  Mild   Onset quality:  Gradual   Duration:  1 day   Timing:  Intermittent Rhinorrhea:    Quality:  Clear   Severity:  Mild   Duration:  1 day   Timing:  Intermittent Sore throat:    Severity:  Mild   Onset quality:  Gradual   Duration:  1 day   Timing:  Constant Severity:  Mild Duration:  1 day Progression:  Unchanged Chronicity:  New Relieved by:  None tried Worsened by:  Nothing tried Associated symptoms: myalgias   Associated symptoms: no headaches, no sinus pain, no swollen glands and no wheezing   Risk factors: recent illness     Past Medical History  Diagnosis Date  . Anemia   . GERD (gastroesophageal reflux disease)     tums prn  . Incompetent cervix    Past Surgical History  Procedure Laterality Date  . Cesarean section  03/07, 04/09, 12/11  . Appendectomy  04/08/2011  . Cervical cerclage      x2  . Cervical cerclage  04/19/2012    Procedure: CERCLAGE CERVICAL;  Surgeon: Meriel Picaichard M Holland, MD;  Location: [facility];  Service: Gynecology;   Laterality: N/A;  . Dilation and curettage of uterus  05/23/2012    Procedure: DILATATION AND CURETTAGE;  Surgeon: Leslie AndreaJames E Tomblin II, MD;  Location: [facility];  Service: Gynecology;  Laterality: N/A;   Family History  Problem Relation Age of Onset  . Hypertension Mother   . Hypertension Father    History  Substance Use Topics  . Smoking status: Never Smoker   . Smokeless tobacco: Never Used  . Alcohol Use: No   OB History   Grav Para Term Preterm Abortions TAB SAB Ect Mult Living   6 4 2 1 2 1 1  0 0 4     Review of Systems  Constitutional: Positive for chills. Negative for fever.  HENT: Positive for congestion, rhinorrhea and sore throat.   Respiratory: Positive for cough. Negative for wheezing.   Gastrointestinal: Negative for nausea and diarrhea.  Musculoskeletal: Positive for myalgias.  Neurological: Negative for dizziness and headaches.  All other systems reviewed and are negative.    Allergies  Contrast media  Home Medications   Current Outpatient Rx  Name  Route  Sig  Dispense  Refill  . guaiFENesin-dextromethorphan (ROBITUSSIN DM) 100-10 MG/5ML syrup   Oral   Take 10 mLs by mouth every 4 (four) hours as needed for cough.         . Multiple Vitamin (MULTIVITAMIN WITH MINERALS) TABS tablet  Oral   Take 1 tablet by mouth daily.         . nitrofurantoin, macrocrystal-monohydrate, (MACROBID) 100 MG capsule   Oral   Take 100 mg by mouth 2 (two) times daily.         Marland Kitchen oseltamivir (TAMIFLU) 75 MG capsule   Oral   Take 1 capsule (75 mg total) by mouth every 12 (twelve) hours.   10 capsule   0    BP 121/82  Pulse 100  Temp(Src) 98.8 F (37.1 C) (Oral)  Resp 18  SpO2 99% Physical Exam  Nursing note and vitals reviewed. Constitutional: She is oriented to person, place, and time. She appears well-developed and well-nourished. No distress.  HENT:  Head: Normocephalic and atraumatic.  Right Ear: External ear normal.  Left Ear: External ear normal.   Mouth/Throat: Oropharynx is clear and moist. No oropharyngeal exudate.  Eyes: Pupils are equal, round, and reactive to light.  Neck: Normal range of motion.  Cardiovascular: Regular rhythm.  Tachycardia present.   Pulmonary/Chest: Effort normal. She has no wheezes. She exhibits no tenderness.  Abdominal: Soft. She exhibits no distension.  Musculoskeletal: Normal range of motion. She exhibits no tenderness.  Lymphadenopathy:    She has no cervical adenopathy.  Neurological: She is alert and oriented to person, place, and time.  Skin: Skin is warm. No rash noted. No erythema.    ED Course  Procedures (including critical care time) Labs Review Labs Reviewed  RAPID STREP SCREEN  CULTURE, GROUP A STREP   Imaging Review No results found.  EKG Interpretation   None       MDM   1. Influenza        Arman Filter, NP 08/28/13 [phone]

## 2013-08-27 NOTE — ED Notes (Signed)
Pt in c/o cough and congestion with chills and body aches since yesterday, unknown if fever at home, no distress noted

## 2013-08-28 LAB — RAPID STREP SCREEN (MED CTR MEBANE ONLY): STREPTOCOCCUS, GROUP A SCREEN (DIRECT): NEGATIVE

## 2013-08-28 MED ORDER — OSELTAMIVIR PHOSPHATE 75 MG PO CAPS: 75.0000 mg | ORAL_CAPSULE | Freq: Two times a day (BID) | ORAL | Status: DC

## 2013-08-28 NOTE — Discharge Instructions (Signed)
Take alternating doses of tylenol/ motrin every 4 hours for fever and body aches

## 2013-08-29 LAB — CULTURE, GROUP A STREP

## 2013-08-29 NOTE — ED Provider Notes (Signed)
Medical screening examination/treatment/procedure(s) were performed by non-physician practitioner and as supervising physician I was immediately available for consultation/collaboration.     Geoffery Lyonsouglas Uchechukwu Dhawan, MD 08/29/13 [phone]

## 2014-06-15 ENCOUNTER — Encounter (HOSPITAL_COMMUNITY): Payer: Self-pay | Admitting: Emergency Medicine

## 2015-04-06 ENCOUNTER — Other Ambulatory Visit: Payer: Self-pay | Admitting: Obstetrics and Gynecology

## 2015-04-07 LAB — CYTOLOGY - PAP

## 2015-06-30 ENCOUNTER — Ambulatory Visit (INDEPENDENT_AMBULATORY_CARE_PROVIDER_SITE_OTHER): Payer: BLUE CROSS/BLUE SHIELD | Admitting: Family Medicine

## 2015-06-30 VITALS — BP 118/86 | HR 92 | Temp 98.6°F | Resp 16 | Ht 63.0 in | Wt 190.0 lb

## 2015-06-30 DIAGNOSIS — M549 Dorsalgia, unspecified: Secondary | ICD-10-CM | POA: Diagnosis not present

## 2015-06-30 DIAGNOSIS — N10 Acute tubulo-interstitial nephritis: Secondary | ICD-10-CM | POA: Diagnosis not present

## 2015-06-30 DIAGNOSIS — A499 Bacterial infection, unspecified: Secondary | ICD-10-CM | POA: Diagnosis not present

## 2015-06-30 DIAGNOSIS — R300 Dysuria: Secondary | ICD-10-CM | POA: Diagnosis not present

## 2015-06-30 DIAGNOSIS — N390 Urinary tract infection, site not specified: Secondary | ICD-10-CM | POA: Diagnosis not present

## 2015-06-30 LAB — POCT URINALYSIS DIP (MANUAL ENTRY)
Bilirubin, UA: NEGATIVE
Glucose, UA: NEGATIVE
Ketones, POC UA: NEGATIVE
Nitrite, UA: POSITIVE — AB
Protein Ur, POC: 30 — AB
Spec Grav, UA: 1.02
Urobilinogen, UA: 1
pH, UA: 7

## 2015-06-30 LAB — POCT WET + KOH PREP
Trich by wet prep: ABSENT
Yeast by KOH: ABSENT
Yeast by wet prep: ABSENT

## 2015-06-30 LAB — POC MICROSCOPIC URINALYSIS (UMFC): Mucus: ABSENT

## 2015-06-30 LAB — POCT URINE PREGNANCY: Preg Test, Ur: NEGATIVE

## 2015-06-30 MED ORDER — CEFTRIAXONE SODIUM 1 G IJ SOLR
1.0000 g | Freq: Once | INTRAMUSCULAR | Status: AC
  Administered 2015-06-30: 1 g via INTRAMUSCULAR

## 2015-06-30 MED ORDER — CEPHALEXIN 500 MG PO CAPS: 500.0000 mg | ORAL_CAPSULE | Freq: Two times a day (BID) | ORAL | Status: DC

## 2015-06-30 MED ORDER — PHENAZOPYRIDINE HCL 200 MG PO TABS: 200.0000 mg | ORAL_TABLET | Freq: Three times a day (TID) | ORAL | Status: DC | PRN

## 2015-06-30 NOTE — Progress Notes (Signed)
Chief Complaint:  Chief Complaint  Patient presents with  . Dysuria    x1 day  . burning urination    x1 day, pt would like a vaginal exam    HPI: Sonya Harrison is a 34 y.o. female who reports to [facility] today complaining of dysuria, she had it since yesterday. She has pain inher back as well. No itchiness or dc, she has a strong odor. LMP was 3 weeks ago. She is not sure if she is pregnant. HAs had chills, was not able to work because of how she was feeling. No nausea. No abd pain   Past Medical History  Diagnosis Date  . Anemia   . GERD (gastroesophageal reflux disease)     tums prn  . Incompetent cervix    Past Surgical History  Procedure Laterality Date  . Cesarean section  03/07, 04/09, 12/11  . Appendectomy  04/08/2011  . Cervical cerclage      x2  . Cervical cerclage  04/19/2012    Procedure: CERCLAGE CERVICAL;  Surgeon: [name], MD;  Location: [facility];  Service: Gynecology;  Laterality: N/A;  . Dilation and curettage of uterus  05/23/2012    Procedure: DILATATION AND CURETTAGE;  Surgeon: Leslie Andrea, MD;  Location: [facility];  Service: Gynecology;  Laterality: N/A;   Social History   Social History  . Marital Status: Married    Spouse Name: N/A  . Number of Children: N/A  . Years of Education: N/A   Social History Main Topics  . Smoking status: Never Smoker   . Smokeless tobacco: Never Used  . Alcohol Use: No  . Drug Use: No  . Sexual Activity: Yes    Birth Control/ Protection: None     Comment: pregnant   Other Topics Concern  . None   Social History Narrative   Family History  Problem Relation Age of Onset  . Hypertension Mother   . Hypertension Father    Allergies  Allergen Reactions  . Contrast Media [Iodinated Diagnostic Agents] Hives   Prior to Admission medications   Medication Sig Start Date End Date Taking? Authorizing Provider  guaiFENesin-dextromethorphan (ROBITUSSIN DM) 100-10 MG/5ML syrup Take 10 mLs by mouth  every 4 (four) hours as needed for cough.    Historical Provider, MD  Multiple Vitamin (MULTIVITAMIN WITH MINERALS) TABS tablet Take 1 tablet by mouth daily.    Historical Provider, MD  nitrofurantoin, macrocrystal-monohydrate, (MACROBID) 100 MG capsule Take 100 mg by mouth 2 (two) times daily.    Historical Provider, MD  oseltamivir (TAMIFLU) 75 MG capsule Take 1 capsule (75 mg total) by mouth every 12 (twelve) hours. Patient not taking: Reported on 06/30/2015 08/28/13   Earley Favor, NP     ROS: The patient denies fevers,  night sweats, unintentional weight loss, chest pain, palpitations, wheezing, dyspnea on exertion, nausea, vomiting, abdominal pain, dysuria, hematuria, melena, numbness, weakness, or tingling.   All other systems have been reviewed and were otherwise negative with the exception of those mentioned in the HPI and as above.    PHYSICAL EXAM: Filed Vitals:   06/30/15 0825  BP: 118/86  Pulse: 92  Temp: 98.6 F (37 C)  Resp: 16   Body mass index is 33.67 kg/(m^2).   General: Alert, no acute distress HEENT:  Normocephalic, atraumatic, oropharynx patent. EOMI, PERRLA Cardiovascular:  Regular rate and rhythm, no rubs murmurs or gallops.  No Carotid bruits, radial pulse intact. No pedal edema.  Respiratory: Clear to auscultation bilaterally.  No wheezes, rales, or rhonchi.  No cyanosis, no use of accessory musculature Abdominal: No organomegaly, abdomen is soft and + CVA tender, positive bowel sounds. No masses. Skin: No rashes. Neurologic: Facial musculature symmetric. Psychiatric: Patient acts appropriately throughout our interaction. Lymphatic: No cervical or submandibular lymphadenopathy Musculoskeletal: Gait intact. No edema, tenderness   LABS: Results for orders placed or performed in visit on 06/30/15  POCT urinalysis dipstick  Result Value Ref Range   Color, UA orange (A) yellow   Clarity, UA cloudy (A) clear   Glucose, UA negative negative   Bilirubin, UA  negative negative   Ketones, POC UA negative negative   Spec Grav, UA 1.020    Blood, UA moderate (A) negative   pH, UA 7.0    Protein Ur, POC =30 (A) negative   Urobilinogen, UA 1.0    Nitrite, UA Positive (A) Negative   Leukocytes, UA large (3+) (A) Negative  POCT Microscopic Urinalysis ([facility])  Result Value Ref Range   WBC,UR,HPF,POC Many (A) None WBC/hpf   RBC,UR,HPF,POC Moderate (A) None RBC/hpf   Bacteria Many (A) None, Too numerous to count   Mucus Absent Absent   Epithelial Cells, UR Per Microscopy Few (A) None, Too numerous to count cells/hpf  POCT Wet + KOH Prep  Result Value Ref Range   Yeast by KOH Absent Present, Absent   Yeast by wet prep Absent Present, Absent   WBC by wet prep None None, Few, Too numerous to count   Clue Cells Wet Prep HPF POC None None, Too numerous to count   Trich by wet prep Absent Present, Absent   Bacteria Wet Prep HPF POC Many (A) None, Few, Too numerous to count   Epithelial Cells By [employer]et Pref ([facility]) Many (A) None, Few, Too numerous to count   RBC,UR,HPF,POC None None RBC/hpf  POCT urine pregnancy  Result Value Ref Range   Preg Test, Ur Negative Negative     EKG/XRAY:   Primary read interpreted [facility] Conley [facility]sLe [facility].   ASSESSMENT/PLAN: Encounter Diagnoses  Name Primary?  . Dysuria   . UTI (urinary tract infection), bacterial   . Acute pyelonephritis Yes   Rx keflex Received rocephin in office Fu prn   Gross sideeffects, risk and benefits, and alternatives of medications d/w patient. Patient is aware that all medications have potential sideeffects and we are unable to predict every sideeffect or drug-drug interaction that may occur.  Simon Aaberg DO  06/30/2015 9:54 AM   07/02/2015-LM about urine cx results.

## 2015-06-30 NOTE — Patient Instructions (Signed)

## 2015-07-02 LAB — URINE CULTURE: Colony Count: >=100000

## 2015-09-14 LAB — OB RESULTS CONSOLE ABO/RH: RH Type: POSITIVE

## 2015-09-14 LAB — OB RESULTS CONSOLE ANTIBODY SCREEN: Antibody Screen: NEGATIVE

## 2015-09-14 LAB — OB RESULTS CONSOLE GC/CHLAMYDIA
CHLAMYDIA, DNA PROBE: NEGATIVE
Gonorrhea: NEGATIVE

## 2015-09-14 LAB — OB RESULTS CONSOLE RUBELLA ANTIBODY, IGM: Rubella: IMMUNE

## 2015-09-14 LAB — OB RESULTS CONSOLE HEPATITIS B SURFACE ANTIGEN: Hepatitis B Surface Ag: NEGATIVE

## 2015-09-14 LAB — OB RESULTS CONSOLE HIV ANTIBODY (ROUTINE TESTING): HIV: NONREACTIVE

## 2015-09-14 LAB — OB RESULTS CONSOLE RPR: RPR: NONREACTIVE

## 2015-09-21 LAB — OB RESULTS CONSOLE GC/CHLAMYDIA
Chlamydia: NEGATIVE
GC PROBE AMP, GENITAL: NEGATIVE

## 2015-09-21 NOTE — H&P (Signed)
Sonya Harrison  DICTATION # [id number] CSN# [id number]   Sonya Pica, MD 09/21/2015 2:23 PM

## 2015-09-24 NOTE — H&P (Signed)
[name], Harrison NO.:  [id number]  MEDICAL RECORD NO.:  [id number]  LOCATION:                                FACILITY:  [facility]  PHYSICIAN:  Duke Salvia. [name], M.D.DATE OF BIRTH:  12-12-1980  DATE OF ADMISSION:  10/14/2015 DATE OF DISCHARGE:                             HISTORY & PHYSICAL   CHIEF COMPLAINT:  For cervical cerclage at [redacted] weeks gestation.  HPI:  A 35 year old, G7, P-2-1-3-3 at [redacted] weeks gestation presents for cervical cerclage with a history of incompetent cervix.  The first pregnancy in 07.  She had a cesarean section at term, later that year, had an IUFD at 20 weeks felt to be secondary to an incompetent cervix. In 2009, she had a cerclage placed, made at the 30 weeks, had a marginal abruption with suction done at that time, followed by another successful pregnancy in 2011, went all the way to term with a cerclage.  Last pregnancy in 2013, did have a cerclage, but had an IUFD at 20 weeks. She presents now after normal genetic screening for McDonald cerclage. This procedure including specific risks related to bleeding infection, adjacent organ injury reviewed.  She had been on extra supplemental progesterone with the 1st trimester.  PAST MEDICAL HISTORY:  Cesarean section x3.  She has had an appendectomy in 2012.  CURRENT MEDICATIONS:  Include Prometrium, prenatal vitamins, Diclegis p.r.n.  Please see the [facility] form for the remainder of her social and family history.  PHYSICAL EXAM:  VITAL SIGNS:  Temp 98.2, blood pressure 120/78. HEENT:  Unremarkable. NECK:  Supple without masses. LUNGS:  Clear. CARDIOVASCULAR:  Regular rate and rhythm without murmurs, rubs, gallops. BREASTS:  Without masses. ABDOMEN:  Soft, flat, nontender.  Fundal height consistent with 14 weeks.  Fetal heart rate 140.  Cervix close. EXTREMITIES:  Unremarkable. NEUROLOGIC:  Unremarkable.  IMPRESSION:  A 14 week intrauterine pregnancy, history of  incompetent cervix.  PLAN:  McDonald cerclage.  Procedure and risks discussed as above.    Kennth Vanbenschoten M. [name], M.D.    RM[facility]/MEDQ  D:  09/21/2015  T:  09/21/2015  Job:  [id number]

## 2015-10-13 MED ORDER — DEXTROSE 5 % IV SOLN
2.0000 g | INTRAVENOUS | Status: AC
  Administered 2015-10-14: 2 g via INTRAVENOUS
  Filled 2015-10-13: qty 2

## 2015-10-14 ENCOUNTER — Encounter (HOSPITAL_COMMUNITY): Payer: Self-pay | Admitting: *Deleted

## 2015-10-14 ENCOUNTER — Encounter (HOSPITAL_COMMUNITY)
Admission: RE | Disposition: A | Discharge status: Home / Self Care | Payer: Self-pay | Source: Ambulatory Visit | Attending: Obstetrics and Gynecology

## 2015-10-14 ENCOUNTER — Ambulatory Visit (HOSPITAL_COMMUNITY): Payer: BLUE CROSS/BLUE SHIELD | Admitting: Anesthesiology

## 2015-10-14 ENCOUNTER — Ambulatory Visit (HOSPITAL_COMMUNITY)
Admission: RE | Admit: 2015-10-14 | Discharge: 2015-10-14 | Disposition: A | Discharge status: Home / Self Care | Payer: BLUE CROSS/BLUE SHIELD | Source: Ambulatory Visit | Attending: Obstetrics and Gynecology | Admitting: Obstetrics and Gynecology

## 2015-10-14 DIAGNOSIS — O99212 Obesity complicating pregnancy, second trimester: Secondary | ICD-10-CM | POA: Diagnosis not present

## 2015-10-14 DIAGNOSIS — Z6834 Body mass index (BMI) 34.0-34.9, adult: Secondary | ICD-10-CM | POA: Insufficient documentation

## 2015-10-14 DIAGNOSIS — O34219 Maternal care for unspecified type scar from previous cesarean delivery: Secondary | ICD-10-CM | POA: Insufficient documentation

## 2015-10-14 DIAGNOSIS — O3432 Maternal care for cervical incompetence, second trimester: Secondary | ICD-10-CM | POA: Insufficient documentation

## 2015-10-14 HISTORY — PX: CERVICAL CERCLAGE: SHX1329

## 2015-10-14 LAB — CBC
HEMATOCRIT: 32.9 % — AB (ref 36.0–46.0)
HEMOGLOBIN: 11.3 g/dL — AB (ref 12.0–15.0)
MCH: 26.6 pg (ref 26.0–34.0)
MCHC: 34.3 g/dL (ref 30.0–36.0)
MCV: 77.4 fL — AB (ref 78.0–100.0)
Platelets: 372 10*3/uL (ref 150–400)
RBC: 4.25 MIL/uL (ref 3.87–5.11)
RDW: 14.9 % (ref 11.5–15.5)
WBC: 13 10*3/uL — ABNORMAL HIGH (ref 4.0–10.5)

## 2015-10-14 LAB — TYPE AND SCREEN
ABO/RH(D): O POS
Antibody Screen: NEGATIVE

## 2015-10-14 SURGERY — CERCLAGE, CERVIX, VAGINAL APPROACH
Anesthesia: Spinal

## 2015-10-14 MED ORDER — LACTATED RINGERS IV SOLN
INTRAVENOUS | Status: DC
  Administered 2015-10-14 (×2): via INTRAVENOUS

## 2015-10-14 MED ORDER — PROPOFOL 10 MG/ML IV BOLUS
INTRAVENOUS | Status: AC
  Filled 2015-10-14: qty 20

## 2015-10-14 MED ORDER — OXYCODONE HCL 5 MG/5ML PO SOLN: 5.0000 mg | Freq: Once | ORAL | Status: DC | PRN

## 2015-10-14 MED ORDER — BUPIVACAINE IN DEXTROSE 0.75-8.25 % IT SOLN
INTRATHECAL | Status: DC | PRN
  Administered 2015-10-14: 1.2 mL via INTRATHECAL

## 2015-10-14 MED ORDER — OXYCODONE HCL 5 MG PO TABS: 5.0000 mg | ORAL_TABLET | Freq: Once | ORAL | Status: DC | PRN

## 2015-10-14 MED ORDER — FENTANYL CITRATE (PF) 100 MCG/2ML IJ SOLN
INTRAMUSCULAR | Status: DC | PRN
  Administered 2015-10-14 (×4): 50 ug via INTRAVENOUS

## 2015-10-14 MED ORDER — ACETAMINOPHEN 325 MG PO TABS: 325.0000 mg | ORAL_TABLET | ORAL | Status: DC | PRN

## 2015-10-14 MED ORDER — LIDOCAINE HCL (CARDIAC) 20 MG/ML IV SOLN
INTRAVENOUS | Status: AC
  Filled 2015-10-14: qty 5

## 2015-10-14 MED ORDER — ACETAMINOPHEN 160 MG/5ML PO SOLN: 325.0000 mg | ORAL | Status: DC | PRN

## 2015-10-14 MED ORDER — FENTANYL CITRATE (PF) 100 MCG/2ML IJ SOLN
INTRAMUSCULAR | Status: AC
  Filled 2015-10-14: qty 2

## 2015-10-14 MED ORDER — FENTANYL CITRATE (PF) 100 MCG/2ML IJ SOLN: 25.0000 ug | INTRAMUSCULAR | Status: DC | PRN

## 2015-10-14 SURGICAL SUPPLY — 19 items
CLOTH BEACON ORANGE TIMEOUT ST (SAFETY) ×3 IMPLANT
COUNTER NEEDLE 1200 MAGNETIC (NEEDLE) IMPLANT
GLOVE BIO SURGEON STRL SZ7 (GLOVE) ×3 IMPLANT
GLOVE BIOGEL PI IND STRL 7.0 (GLOVE) ×1 IMPLANT
GLOVE BIOGEL PI INDICATOR 7.0 (GLOVE) ×2
GOWN STRL REUS W/TWL LRG LVL3 (GOWN DISPOSABLE) ×6 IMPLANT
NS IRRIG 1000ML POUR BTL (IV SOLUTION) ×3 IMPLANT
PACK VAGINAL MINOR WOMEN LF (CUSTOM PROCEDURE TRAY) ×3 IMPLANT
PAD OB MATERNITY 4.3X12.25 (PERSONAL CARE ITEMS) ×3 IMPLANT
PAD PREP 24X48 CUFFED NSTRL (MISCELLANEOUS) ×3 IMPLANT
SUT MERSILENE 5MM BP 1 12 (SUTURE) IMPLANT
SUT TEVDEK  2 DEKNATEL (SUTURE) IMPLANT
SUT TICRON 2 BLUE 36 GS-21 (SUTURE) ×3 IMPLANT
TOWEL OR 17X24 6PK STRL BLUE (TOWEL DISPOSABLE) ×6 IMPLANT
TRAY FOLEY CATH SILVER 14FR (SET/KITS/TRAYS/PACK) ×3 IMPLANT
TUBING NON-CON 1/4 X 20 CONN (TUBING) IMPLANT
TUBING NON-CON 1/4 X 20' CONN (TUBING)
WATER STERILE IRR 1000ML POUR (IV SOLUTION) ×3 IMPLANT
YANKAUER SUCT BULB TIP NO VENT (SUCTIONS) IMPLANT

## 2015-10-14 NOTE — Anesthesia Preprocedure Evaluation (Signed)
Anesthesia Evaluation  Patient identified by MRN, date of birth, ID band Patient awake    Reviewed: Allergy & Precautions, NPO status , Patient's Chart, lab work & pertinent test results  History of Anesthesia Complications Negative for: history of anesthetic complications  Airway Mallampati: II  TM Distance: >3 FB Neck ROM: Full    Dental  (+) Teeth Intact   Pulmonary neg pulmonary ROS,    breath sounds clear to auscultation       Cardiovascular negative cardio ROS   Rhythm:Regular     Neuro/Psych negative neurological ROS  negative psych ROS   GI/Hepatic negative GI ROS, Neg liver ROS,   Endo/Other  Morbid obesity  Renal/GU negative Renal ROS     Musculoskeletal   Abdominal   Peds  Hematology  (+) anemia ,   Anesthesia Other Findings   Reproductive/Obstetrics (+) Pregnancy                             Anesthesia Physical Anesthesia Plan  ASA: II  Anesthesia Plan: Spinal   Post-op Pain Management:    Induction: Intravenous  Airway Management Planned: Nasal Cannula, Natural Airway and Simple Face Mask  Additional Equipment: None  Intra-op Plan:   Post-operative Plan:   Informed Consent: I have reviewed the patients History and Physical, chart, labs and discussed the procedure including the risks, benefits and alternatives for the proposed anesthesia with the patient or authorized representative who has indicated his/her understanding and acceptance.   Dental advisory given  Plan Discussed with: CRNA and Surgeon  Anesthesia Plan Comments:         Anesthesia Quick Evaluation

## 2015-10-14 NOTE — Op Note (Signed)
Sonya Harrison  DICTATION # [id number] CSN# [id number]   [name], MD 10/14/2015 1:22 PM

## 2015-10-14 NOTE — Anesthesia Postprocedure Evaluation (Signed)
Anesthesia Post Note  Patient: Sonya Harrison  Procedure(s) Performed: Procedure(s) (LRB): CERCLAGE CERVICAL (N/A)  Patient location during evaluation: PACU Anesthesia Type: Spinal Level of consciousness: oriented and awake and alert Pain management: pain level controlled Vital Signs Assessment: post-procedure vital signs reviewed and stable Respiratory status: spontaneous breathing, respiratory function stable and patient connected to nasal cannula oxygen Cardiovascular status: blood pressure returned to baseline and stable Postop Assessment: no headache and no backache Anesthetic complications: no    Last Vitals:  Filed Vitals:   10/14/15 1500 10/14/15 1515  BP: 119/80 118/89  Pulse: 103 118  Temp:    Resp: 18 22    Last Pain: There were no vitals filed for this visit.               Phillips Grout

## 2015-10-14 NOTE — Transfer of Care (Signed)
Immediate Anesthesia Transfer of Care Note  Patient: Sonya Harrison  Procedure(s) Performed: Procedure(s): CERCLAGE CERVICAL (N/A)  Patient Location: PACU  Anesthesia Type:Spinal  Level of Consciousness: awake, alert , oriented and patient cooperative  Airway & Oxygen Therapy: Patient Spontanous Breathing  Post-op Assessment: Report given to RN and Post -op Vital signs reviewed and stable  Post vital signs: Reviewed and stable  Last Vitals:  Filed Vitals:   10/14/15 1132  BP: 118/63  Pulse: 11  Temp: 36.9 C  Resp: 18    Complications: No apparent anesthesia complications

## 2015-10-14 NOTE — Anesthesia Procedure Notes (Signed)
Spinal Patient location during procedure: OR Staffing Anesthesiologist: Genine Beckett Performed by: anesthesiologist  Preanesthetic Checklist Completed: patient identified, site marked, surgical consent, pre-op evaluation, timeout performed, IV checked, risks and benefits discussed and monitors and equipment checked Spinal Block Patient position: sitting Prep: Betadine Patient monitoring: heart rate, continuous pulse ox and blood pressure Approach: midline Location: L3-4 Injection technique: single-shot Needle Needle type: Sprotte  Needle gauge: 24 G Needle length: 9 cm Additional Notes Expiration date of kit checked and confirmed. Patient tolerated procedure well, without complications.     

## 2015-10-14 NOTE — Discharge Instructions (Signed)
Cervical Cerclage, Care After Refer to this sheet in the next few weeks. These instructions provide you with information on caring for yourself after your procedure. Your health care provider may also give you more specific instructions. Your treatment has been planned according to current medical practices, but problems sometimes occur. Call your health care provider if you have any problems or questions after your procedure. WHAT TO EXPECT AFTER THE PROCEDURE  After your procedure, it is typical to have the following:  Abdominal cramping.  Vaginal spotting. HOME CARE INSTRUCTIONS   Only take over-the-counter or prescription medicines for pain, discomfort, or fever as directed by your health care provider.  Avoid physical activities and exercise until your health care provider says it is okay.  Do not douche or have sexual intercourse until your health care provider tells you it is okay.  Keep your follow-up surgical and prenatal appointments with your health care provider. SEEK MEDICAL CARE IF:   You have abnormal vaginal discharge.  You have a rash.  You become lightheaded or feel faint.  You have abdominal pain that is not controlled with pain medicine. SEEK IMMEDIATE MEDICAL CARE IF:   You develop vaginal bleeding.  You are leaking fluid or have a gush of fluid from the vagina.  You have a fever.  You faint.  You have uterine contractions.  You feel your baby is not moving as much as usual, or you cannot feel your baby move.  You have chest pain or shortness of breath.   This information is not intended to replace advice given to you by your health care provider. Make sure you discuss any questions you have with your health care provider.   Document Released: 05/21/2013 Document Revised: 08/05/2013 Document Reviewed: 05/21/2013 Elsevier Interactive Patient Education 2016 [employer]  Patient Signature _________________________________  Date:  _______________  Nurse Signature  _________________________________  Date: _______________

## 2015-10-14 NOTE — Progress Notes (Signed)
The patient was re-examined with no change in status 

## 2015-10-15 ENCOUNTER — Encounter (HOSPITAL_COMMUNITY): Payer: Self-pay | Admitting: Obstetrics and Gynecology

## 2015-10-15 NOTE — Op Note (Signed)
[name]:  Harrison, Sonya              ACCOUNT NO.:  [id number]  MEDICAL RECORD NO.:  [id number]  LOCATION:                                 FACILITY:  PHYSICIAN:  Duke Salviaichard M. Marcelle OverlieHolland, M.D.DATE OF BIRTH:  [birth year]  DATE OF PROCEDURE: DATE OF DISCHARGE:                              OPERATIVE REPORT   PREOPERATIVE DIAGNOSIS:  Fourteen week pregnancy, incompetent cervix.  POSTOPERATIVE DIAGNOSIS:  Fourteen week pregnancy, incompetent cervix.  PROCEDURE:  McDonald cerclage.  ANESTHESIA:  Regional.  COMPLICATIONS:  None.  BLOOD LOSS:  Minimal.  PROCEDURE AND FINDINGS:  Patient was taken to operating room after an adequate level of regional anesthesia was obtained, the patient's legs in stirrups.  Perineum and vagina were prepped and draped.  Foley catheter was positioned, this was done after appropriate time-outs were taken.  Weighted speculum was placed with right angle retractors for better exposure using a #2 TiCron braided polyester suture starting at 11 o'clock.  Pursestring cerclage was positioned right at the reflection of the bladder caring around posteriorly in a pursestring fashion.  This was tied down at the top creating good cervical length, minimal bleeding.  Urine was clear at the end of the procedure.  She tolerated this well, went to recovery room in good condition.     Beckham Capistran M. Marcelle OverlieHolland, M.D.     RMH/MEDQ  D:  10/14/2015  T:  10/14/2015  Job:  [id number]

## 2015-11-11 ENCOUNTER — Other Ambulatory Visit (HOSPITAL_COMMUNITY): Payer: Self-pay | Admitting: Obstetrics and Gynecology

## 2015-11-11 ENCOUNTER — Encounter (HOSPITAL_COMMUNITY): Payer: Self-pay

## 2015-11-11 ENCOUNTER — Ambulatory Visit (HOSPITAL_COMMUNITY)
Admission: RE | Admit: 2015-11-11 | Discharge: 2015-11-11 | Disposition: A | Discharge status: Home / Self Care | Payer: BLUE CROSS/BLUE SHIELD | Source: Ambulatory Visit | Attending: Obstetrics and Gynecology | Admitting: Obstetrics and Gynecology

## 2015-11-11 DIAGNOSIS — O26879 Cervical shortening, unspecified trimester: Secondary | ICD-10-CM

## 2015-11-11 DIAGNOSIS — O09522 Supervision of elderly multigravida, second trimester: Secondary | ICD-10-CM

## 2015-11-11 DIAGNOSIS — Z3A18 18 weeks gestation of pregnancy: Secondary | ICD-10-CM

## 2015-11-11 DIAGNOSIS — O3432 Maternal care for cervical incompetence, second trimester: Secondary | ICD-10-CM

## 2015-11-11 DIAGNOSIS — O09212 Supervision of pregnancy with history of pre-term labor, second trimester: Secondary | ICD-10-CM

## 2015-11-11 DIAGNOSIS — O34219 Maternal care for unspecified type scar from previous cesarean delivery: Secondary | ICD-10-CM | POA: Diagnosis not present

## 2015-11-11 DIAGNOSIS — Z3689 Encounter for other specified antenatal screening: Secondary | ICD-10-CM

## 2015-11-11 HISTORY — DX: Encounter for other specified aftercare: Z51.89

## 2015-11-11 LAB — OB RESULTS CONSOLE HIV ANTIBODY (ROUTINE TESTING): HIV: NONREACTIVE

## 2015-11-11 LAB — OB RESULTS CONSOLE RPR: RPR: NONREACTIVE

## 2015-11-12 ENCOUNTER — Other Ambulatory Visit (HOSPITAL_COMMUNITY): Payer: Self-pay | Admitting: *Deleted

## 2015-11-12 ENCOUNTER — Ambulatory Visit (HOSPITAL_COMMUNITY): Payer: BLUE CROSS/BLUE SHIELD

## 2015-11-12 DIAGNOSIS — O3432 Maternal care for cervical incompetence, second trimester: Secondary | ICD-10-CM

## 2015-11-17 ENCOUNTER — Other Ambulatory Visit (HOSPITAL_COMMUNITY): Payer: Self-pay | Admitting: Maternal and Fetal Medicine

## 2015-11-17 ENCOUNTER — Encounter (HOSPITAL_COMMUNITY): Payer: Self-pay

## 2015-11-17 ENCOUNTER — Ambulatory Visit (HOSPITAL_COMMUNITY)
Admission: RE | Admit: 2015-11-17 | Discharge: 2015-11-17 | Disposition: A | Discharge status: Home / Self Care | Payer: BLUE CROSS/BLUE SHIELD | Source: Ambulatory Visit | Attending: Obstetrics and Gynecology | Admitting: Obstetrics and Gynecology

## 2015-11-17 DIAGNOSIS — O3432 Maternal care for cervical incompetence, second trimester: Secondary | ICD-10-CM | POA: Insufficient documentation

## 2015-11-17 DIAGNOSIS — O34219 Maternal care for unspecified type scar from previous cesarean delivery: Secondary | ICD-10-CM | POA: Diagnosis not present

## 2015-11-17 DIAGNOSIS — O09212 Supervision of pregnancy with history of pre-term labor, second trimester: Secondary | ICD-10-CM | POA: Diagnosis not present

## 2015-11-17 DIAGNOSIS — Z3A18 18 weeks gestation of pregnancy: Secondary | ICD-10-CM

## 2015-11-17 DIAGNOSIS — O09522 Supervision of elderly multigravida, second trimester: Secondary | ICD-10-CM

## 2015-11-24 ENCOUNTER — Encounter (HOSPITAL_COMMUNITY): Payer: Self-pay

## 2015-11-24 ENCOUNTER — Ambulatory Visit (HOSPITAL_COMMUNITY)
Admission: RE | Admit: 2015-11-24 | Discharge: 2015-11-24 | Disposition: A | Discharge status: Home / Self Care | Payer: BLUE CROSS/BLUE SHIELD | Source: Ambulatory Visit | Attending: Obstetrics and Gynecology | Admitting: Obstetrics and Gynecology

## 2015-11-24 DIAGNOSIS — O3432 Maternal care for cervical incompetence, second trimester: Secondary | ICD-10-CM

## 2015-11-24 DIAGNOSIS — Z3A19 19 weeks gestation of pregnancy: Secondary | ICD-10-CM | POA: Diagnosis not present

## 2015-11-24 DIAGNOSIS — O34219 Maternal care for unspecified type scar from previous cesarean delivery: Secondary | ICD-10-CM | POA: Insufficient documentation

## 2015-11-24 DIAGNOSIS — O09212 Supervision of pregnancy with history of pre-term labor, second trimester: Secondary | ICD-10-CM | POA: Diagnosis not present

## 2015-11-24 DIAGNOSIS — O09522 Supervision of elderly multigravida, second trimester: Secondary | ICD-10-CM | POA: Diagnosis not present

## 2015-12-01 ENCOUNTER — Encounter (HOSPITAL_COMMUNITY): Payer: Self-pay

## 2015-12-01 ENCOUNTER — Other Ambulatory Visit (HOSPITAL_COMMUNITY): Payer: Self-pay | Admitting: Maternal and Fetal Medicine

## 2015-12-01 ENCOUNTER — Ambulatory Visit (HOSPITAL_COMMUNITY)
Admission: RE | Admit: 2015-12-01 | Discharge: 2015-12-01 | Disposition: A | Discharge status: Home / Self Care | Payer: BLUE CROSS/BLUE SHIELD | Source: Ambulatory Visit | Attending: Obstetrics and Gynecology | Admitting: Obstetrics and Gynecology

## 2015-12-01 DIAGNOSIS — O09212 Supervision of pregnancy with history of pre-term labor, second trimester: Secondary | ICD-10-CM | POA: Insufficient documentation

## 2015-12-01 DIAGNOSIS — O09892 Supervision of other high risk pregnancies, second trimester: Secondary | ICD-10-CM

## 2015-12-01 DIAGNOSIS — Z3A20 20 weeks gestation of pregnancy: Secondary | ICD-10-CM

## 2015-12-01 DIAGNOSIS — O09522 Supervision of elderly multigravida, second trimester: Secondary | ICD-10-CM

## 2015-12-01 DIAGNOSIS — O3432 Maternal care for cervical incompetence, second trimester: Secondary | ICD-10-CM | POA: Diagnosis not present

## 2015-12-01 DIAGNOSIS — O34219 Maternal care for unspecified type scar from previous cesarean delivery: Secondary | ICD-10-CM

## 2015-12-08 ENCOUNTER — Ambulatory Visit (HOSPITAL_COMMUNITY)
Admission: RE | Admit: 2015-12-08 | Discharge: 2015-12-08 | Disposition: A | Discharge status: Home / Self Care | Payer: BLUE CROSS/BLUE SHIELD | Source: Ambulatory Visit | Attending: Obstetrics and Gynecology | Admitting: Obstetrics and Gynecology

## 2015-12-08 ENCOUNTER — Encounter (HOSPITAL_COMMUNITY): Payer: Self-pay

## 2015-12-08 DIAGNOSIS — O3432 Maternal care for cervical incompetence, second trimester: Secondary | ICD-10-CM | POA: Diagnosis present

## 2015-12-08 DIAGNOSIS — Z3A21 21 weeks gestation of pregnancy: Secondary | ICD-10-CM | POA: Diagnosis not present

## 2015-12-08 DIAGNOSIS — O09522 Supervision of elderly multigravida, second trimester: Secondary | ICD-10-CM | POA: Diagnosis not present

## 2015-12-08 DIAGNOSIS — O09212 Supervision of pregnancy with history of pre-term labor, second trimester: Secondary | ICD-10-CM | POA: Diagnosis not present

## 2015-12-08 DIAGNOSIS — O34219 Maternal care for unspecified type scar from previous cesarean delivery: Secondary | ICD-10-CM | POA: Diagnosis not present

## 2015-12-15 ENCOUNTER — Ambulatory Visit (HOSPITAL_COMMUNITY)
Admission: RE | Admit: 2015-12-15 | Discharge: 2015-12-15 | Disposition: A | Discharge status: Home / Self Care | Payer: BLUE CROSS/BLUE SHIELD | Source: Ambulatory Visit | Attending: Obstetrics and Gynecology | Admitting: Obstetrics and Gynecology

## 2015-12-15 ENCOUNTER — Other Ambulatory Visit (HOSPITAL_COMMUNITY): Payer: Self-pay | Admitting: Maternal and Fetal Medicine

## 2015-12-15 ENCOUNTER — Encounter (HOSPITAL_COMMUNITY): Payer: Self-pay

## 2015-12-15 DIAGNOSIS — O09522 Supervision of elderly multigravida, second trimester: Secondary | ICD-10-CM

## 2015-12-15 DIAGNOSIS — O09892 Supervision of other high risk pregnancies, second trimester: Secondary | ICD-10-CM

## 2015-12-15 DIAGNOSIS — Z3A22 22 weeks gestation of pregnancy: Secondary | ICD-10-CM

## 2015-12-15 DIAGNOSIS — O3432 Maternal care for cervical incompetence, second trimester: Secondary | ICD-10-CM | POA: Insufficient documentation

## 2015-12-15 DIAGNOSIS — O34219 Maternal care for unspecified type scar from previous cesarean delivery: Secondary | ICD-10-CM | POA: Diagnosis not present

## 2015-12-15 DIAGNOSIS — O09212 Supervision of pregnancy with history of pre-term labor, second trimester: Secondary | ICD-10-CM | POA: Insufficient documentation

## 2015-12-15 MED ORDER — BETAMETHASONE SOD PHOS & ACET 6 (3-3) MG/ML IJ SUSP
12.0000 mg | Freq: Once | INTRAMUSCULAR | Status: AC
  Administered 2015-12-15: 12 mg via INTRAMUSCULAR
  Filled 2015-12-15: qty 2

## 2015-12-16 ENCOUNTER — Ambulatory Visit (HOSPITAL_COMMUNITY)
Admission: RE | Admit: 2015-12-16 | Discharge: 2015-12-16 | Disposition: A | Discharge status: Home / Self Care | Payer: BLUE CROSS/BLUE SHIELD | Source: Ambulatory Visit | Attending: Obstetrics and Gynecology | Admitting: Obstetrics and Gynecology

## 2015-12-16 DIAGNOSIS — O26879 Cervical shortening, unspecified trimester: Secondary | ICD-10-CM | POA: Insufficient documentation

## 2015-12-16 DIAGNOSIS — O26872 Cervical shortening, second trimester: Secondary | ICD-10-CM

## 2015-12-16 DIAGNOSIS — Z3A23 23 weeks gestation of pregnancy: Secondary | ICD-10-CM

## 2015-12-16 MED ORDER — BETAMETHASONE SOD PHOS & ACET 6 (3-3) MG/ML IJ SUSP
12.0000 mg | Freq: Once | INTRAMUSCULAR | Status: AC
  Administered 2015-12-16: 12 mg via INTRAMUSCULAR
  Filled 2015-12-16: qty 2

## 2015-12-22 ENCOUNTER — Encounter (HOSPITAL_COMMUNITY): Payer: Self-pay

## 2015-12-22 ENCOUNTER — Ambulatory Visit (HOSPITAL_COMMUNITY)
Admission: RE | Admit: 2015-12-22 | Discharge: 2015-12-22 | Disposition: A | Discharge status: Home / Self Care | Payer: BLUE CROSS/BLUE SHIELD | Source: Ambulatory Visit | Attending: Obstetrics and Gynecology | Admitting: Obstetrics and Gynecology

## 2015-12-22 ENCOUNTER — Other Ambulatory Visit (HOSPITAL_COMMUNITY): Payer: Self-pay | Admitting: Maternal and Fetal Medicine

## 2015-12-22 DIAGNOSIS — O3432 Maternal care for cervical incompetence, second trimester: Secondary | ICD-10-CM

## 2015-12-22 DIAGNOSIS — O09522 Supervision of elderly multigravida, second trimester: Secondary | ICD-10-CM | POA: Diagnosis present

## 2015-12-22 DIAGNOSIS — Z3A23 23 weeks gestation of pregnancy: Secondary | ICD-10-CM

## 2015-12-22 DIAGNOSIS — O34219 Maternal care for unspecified type scar from previous cesarean delivery: Secondary | ICD-10-CM

## 2015-12-22 DIAGNOSIS — O09212 Supervision of pregnancy with history of pre-term labor, second trimester: Secondary | ICD-10-CM

## 2015-12-29 ENCOUNTER — Encounter (HOSPITAL_COMMUNITY): Payer: Self-pay

## 2015-12-29 ENCOUNTER — Ambulatory Visit (HOSPITAL_COMMUNITY)
Admission: RE | Admit: 2015-12-29 | Discharge: 2015-12-29 | Disposition: A | Discharge status: Home / Self Care | Payer: BLUE CROSS/BLUE SHIELD | Source: Ambulatory Visit | Attending: Obstetrics and Gynecology | Admitting: Obstetrics and Gynecology

## 2015-12-29 DIAGNOSIS — O3432 Maternal care for cervical incompetence, second trimester: Secondary | ICD-10-CM | POA: Insufficient documentation

## 2015-12-29 DIAGNOSIS — Z3A24 24 weeks gestation of pregnancy: Secondary | ICD-10-CM | POA: Diagnosis not present

## 2015-12-29 DIAGNOSIS — O09522 Supervision of elderly multigravida, second trimester: Secondary | ICD-10-CM | POA: Diagnosis not present

## 2015-12-29 DIAGNOSIS — O09212 Supervision of pregnancy with history of pre-term labor, second trimester: Secondary | ICD-10-CM | POA: Insufficient documentation

## 2015-12-29 DIAGNOSIS — O34219 Maternal care for unspecified type scar from previous cesarean delivery: Secondary | ICD-10-CM | POA: Insufficient documentation

## 2016-01-05 ENCOUNTER — Ambulatory Visit (HOSPITAL_COMMUNITY)
Admission: RE | Admit: 2016-01-05 | Discharge: 2016-01-05 | Disposition: A | Discharge status: Home / Self Care | Payer: BLUE CROSS/BLUE SHIELD | Source: Ambulatory Visit | Attending: Obstetrics and Gynecology | Admitting: Obstetrics and Gynecology

## 2016-01-05 ENCOUNTER — Encounter (HOSPITAL_COMMUNITY): Payer: Self-pay

## 2016-01-05 DIAGNOSIS — Z3A25 25 weeks gestation of pregnancy: Secondary | ICD-10-CM | POA: Insufficient documentation

## 2016-01-05 DIAGNOSIS — O34219 Maternal care for unspecified type scar from previous cesarean delivery: Secondary | ICD-10-CM | POA: Diagnosis not present

## 2016-01-05 DIAGNOSIS — O09213 Supervision of pregnancy with history of pre-term labor, third trimester: Secondary | ICD-10-CM | POA: Insufficient documentation

## 2016-01-05 DIAGNOSIS — O3432 Maternal care for cervical incompetence, second trimester: Secondary | ICD-10-CM | POA: Diagnosis not present

## 2016-01-05 DIAGNOSIS — O09522 Supervision of elderly multigravida, second trimester: Secondary | ICD-10-CM | POA: Insufficient documentation

## 2016-01-05 MED ORDER — BETAMETHASONE SOD PHOS & ACET 6 (3-3) MG/ML IJ SUSP
12.0000 mg | Freq: Once | INTRAMUSCULAR | Status: DC
  Filled 2016-01-05: qty 2

## 2016-01-06 ENCOUNTER — Ambulatory Visit (HOSPITAL_COMMUNITY)
Admission: RE | Admit: 2016-01-06 | Discharge: 2016-01-06 | Disposition: A | Discharge status: Home / Self Care | Payer: BLUE CROSS/BLUE SHIELD | Source: Ambulatory Visit | Attending: Obstetrics and Gynecology | Admitting: Obstetrics and Gynecology

## 2016-01-06 DIAGNOSIS — O34219 Maternal care for unspecified type scar from previous cesarean delivery: Secondary | ICD-10-CM | POA: Insufficient documentation

## 2016-01-06 DIAGNOSIS — O09522 Supervision of elderly multigravida, second trimester: Secondary | ICD-10-CM | POA: Insufficient documentation

## 2016-01-06 DIAGNOSIS — O3432 Maternal care for cervical incompetence, second trimester: Secondary | ICD-10-CM | POA: Insufficient documentation

## 2016-01-06 DIAGNOSIS — Z3A25 25 weeks gestation of pregnancy: Secondary | ICD-10-CM | POA: Diagnosis not present

## 2016-01-06 DIAGNOSIS — O09212 Supervision of pregnancy with history of pre-term labor, second trimester: Secondary | ICD-10-CM | POA: Insufficient documentation

## 2016-01-06 MED ORDER — BETAMETHASONE SOD PHOS & ACET 6 (3-3) MG/ML IJ SUSP
12.0000 mg | Freq: Once | INTRAMUSCULAR | Status: AC
  Administered 2016-01-06: 12 mg via INTRAMUSCULAR
  Filled 2016-01-06: qty 2

## 2016-01-11 ENCOUNTER — Encounter (HOSPITAL_COMMUNITY): Payer: Self-pay

## 2016-01-12 ENCOUNTER — Ambulatory Visit (HOSPITAL_COMMUNITY)
Admission: RE | Admit: 2016-01-12 | Discharge: 2016-01-12 | Disposition: A | Discharge status: Home / Self Care | Payer: BLUE CROSS/BLUE SHIELD | Source: Ambulatory Visit | Attending: Obstetrics and Gynecology | Admitting: Obstetrics and Gynecology

## 2016-01-12 ENCOUNTER — Encounter (HOSPITAL_COMMUNITY): Payer: Self-pay

## 2016-01-12 DIAGNOSIS — O3432 Maternal care for cervical incompetence, second trimester: Secondary | ICD-10-CM

## 2016-01-12 DIAGNOSIS — Z3A26 26 weeks gestation of pregnancy: Secondary | ICD-10-CM | POA: Diagnosis not present

## 2016-01-13 ENCOUNTER — Ambulatory Visit (HOSPITAL_COMMUNITY): Payer: BLUE CROSS/BLUE SHIELD

## 2016-01-18 ENCOUNTER — Encounter (HOSPITAL_COMMUNITY): Payer: Self-pay

## 2016-01-18 ENCOUNTER — Ambulatory Visit (HOSPITAL_COMMUNITY)
Admission: RE | Admit: 2016-01-18 | Discharge: 2016-01-18 | Disposition: A | Discharge status: Home / Self Care | Payer: BLUE CROSS/BLUE SHIELD | Source: Ambulatory Visit | Attending: Obstetrics and Gynecology | Admitting: Obstetrics and Gynecology

## 2016-01-18 ENCOUNTER — Other Ambulatory Visit (HOSPITAL_COMMUNITY): Payer: Self-pay | Admitting: Maternal and Fetal Medicine

## 2016-01-18 DIAGNOSIS — O09522 Supervision of elderly multigravida, second trimester: Secondary | ICD-10-CM | POA: Insufficient documentation

## 2016-01-18 DIAGNOSIS — O3432 Maternal care for cervical incompetence, second trimester: Secondary | ICD-10-CM

## 2016-01-18 DIAGNOSIS — O34219 Maternal care for unspecified type scar from previous cesarean delivery: Secondary | ICD-10-CM | POA: Diagnosis not present

## 2016-01-18 DIAGNOSIS — O09212 Supervision of pregnancy with history of pre-term labor, second trimester: Secondary | ICD-10-CM | POA: Insufficient documentation

## 2016-01-18 DIAGNOSIS — O3433 Maternal care for cervical incompetence, third trimester: Secondary | ICD-10-CM | POA: Diagnosis not present

## 2016-01-18 DIAGNOSIS — Z3A27 27 weeks gestation of pregnancy: Secondary | ICD-10-CM

## 2016-01-18 DIAGNOSIS — Z8751 Personal history of pre-term labor: Secondary | ICD-10-CM

## 2016-03-07 NOTE — H&P (Signed)
Sonya Harrison  DICTATION # [id number] CSN# [id number]   [name], MD 03/07/2016 10:21 AM

## 2016-03-08 NOTE — H&P (Signed)
Sonya Harrison, Sonya Harrison               ACCOUNT NO.:  [id number]  MEDICAL RECORD NO.:  [id number]  LOCATION:                                 FACILITY:  PHYSICIAN:  Duke Salvia. [name], M.D.DATE OF BIRTH:  1981-02-18  DATE OF CONSULTATION: DATE OF DISCHARGE:                                CONSULTATION   CHIEF COMPLAINT:  For repeat cesarean section at term.  HISTORY OF PRESENT ILLNESS:  A 35 year old, G7, P2-1-3-3, EDD is August 31, presents for repeat cesarean section.  She had McDonald cerclage placed at 14 weeks.  This was repeated by MFM approximately 18-19 weeks due to some thinning.  This was done mainly as reinforcement.  She has also been on vaginal progesterone and presents now at term for repeat cesarean section, the cerclage having been removed at 37 weeks.  This procedure including specific risks related to bleeding, infection, transfusion, wound infection, and phlebitis discussed with her which she understands and accepts.  PAST SURGERY HISTORY:  She had miscarriage in 2003, 2007; cesarean at term pregnancy complicated by hypertension and chorioamnionitis in 2007; had an IUFD at 20 weeks with PPROM and incompetent cervix in 2009; and repeat C-section after a cerclage was placed at 14 weeks, delivered at 30 weeks in 2011; and made it to term 9 pounds, 10 ounces.  Repeat cesarean section with a cerclage in 2013, had a cerclage placed but a pregnancy loss at 20 weeks.  Blood type is O positive.  Panoramic testing has been normal.  One-hour GTT was normal at 130.  Also during this pregnancy, she did receive outpatient betamethasone series earlier. Does plan tubal with this pregnancy.  The permanence of procedure failure rated 2 to 10/998 reviewed with her which she understands and accepts.  PAST MEDICAL HISTORY:  Please see the [facility] form for details.  PHYSICAL EXAMINATION:  VITAL SIGNS:  Temp 98.2, blood pressure 130/78. HEENT:  Unremarkable. NECK:  Supple without  masses. LUNGS:  Clear. CARDIOVASCULAR:  Regular rate and rhythm without murmurs, rubs, or gallops. BREASTS:  Not examined. PELVIC:  Term fundal height.  Fetal heart rate 140.  Cervix was closed. Cerclage sutures were removed at 37 weeks. EXTREMITIES:  Unremarkable. NEUROLOGIC:  Unremarkable.  IMPRESSION:  Term pregnancy, history of incompetent cervix.  PLAN:  Repeat cesarean section and tubal ligation.  Procedure risks discussed as above.     Linard Daft M. [name], M.D.   ______________________________ Duke Salvia. [name], M.D.    RMH/MEDQ  D:  03/07/2016  T:  03/08/2016  Job:  [id number]

## 2016-03-29 ENCOUNTER — Encounter (HOSPITAL_COMMUNITY): Payer: Self-pay

## 2016-03-29 LAB — OB RESULTS CONSOLE GBS: GBS: POSITIVE

## 2016-04-01 ENCOUNTER — Encounter (HOSPITAL_COMMUNITY): Payer: Self-pay

## 2016-04-01 ENCOUNTER — Inpatient Hospital Stay (HOSPITAL_COMMUNITY)
Admission: AD | Admit: 2016-04-01 | Discharge: 2016-04-01 | Disposition: A | Discharge status: Home / Self Care | Payer: BLUE CROSS/BLUE SHIELD | Source: Ambulatory Visit | Attending: Obstetrics and Gynecology | Admitting: Obstetrics and Gynecology

## 2016-04-01 DIAGNOSIS — Z3A38 38 weeks gestation of pregnancy: Secondary | ICD-10-CM | POA: Insufficient documentation

## 2016-04-01 DIAGNOSIS — G44209 Tension-type headache, unspecified, not intractable: Secondary | ICD-10-CM

## 2016-04-01 DIAGNOSIS — O26893 Other specified pregnancy related conditions, third trimester: Secondary | ICD-10-CM | POA: Insufficient documentation

## 2016-04-01 DIAGNOSIS — R42 Dizziness and giddiness: Secondary | ICD-10-CM | POA: Diagnosis not present

## 2016-04-01 DIAGNOSIS — R51 Headache: Secondary | ICD-10-CM | POA: Insufficient documentation

## 2016-04-01 LAB — URINALYSIS, ROUTINE W REFLEX MICROSCOPIC
BILIRUBIN URINE: NEGATIVE
Glucose, UA: NEGATIVE mg/dL
KETONES UR: NEGATIVE mg/dL
LEUKOCYTES UA: NEGATIVE
NITRITE: NEGATIVE
PH: 6 (ref 5.0–8.0)
PROTEIN: 30 mg/dL — AB
Specific Gravity, Urine: <1.005 — ABNORMAL LOW (ref 1.005–1.030)

## 2016-04-01 LAB — URINE MICROSCOPIC-ADD ON

## 2016-04-01 NOTE — MAU Note (Signed)
Headache since 8 pm. Dizziness x 2 days.  Blood pressure at home was 120/95.  Took Tylenol, headache calmed down a little. No bleeding. No leaking.  Baby moving.

## 2016-04-01 NOTE — MAU Provider Note (Signed)
Chief Complaint:  Headache and Dizziness   First Provider Initiated Contact with Patient 04/01/16 0556     HPI: Sonya Harrison is a 35 y.o. [id number] at 5238w2dwho presents to maternity admissions reporting elevated blood pressure at home.  States also has a headache, only partially resolved with Tylenol. . She reports good fetal movement, denies LOF, vaginal bleeding, vaginal itching/burning, urinary symptoms, n/v, diarrhea, constipation or fever/chills.  She denies visual changes or RUQ abdominal pain. Does have some epigastric pain.  Headache   This is a new problem. The current episode started today. The problem occurs constantly. The problem has been gradually improving. The pain is located in the bilateral and frontal region. The pain does not radiate. The quality of the pain is described as aching and dull. Associated symptoms include abdominal pain and dizziness. Pertinent negatives include no back pain, blurred vision, fever, muscle aches, nausea, neck pain, photophobia, visual change, vomiting or weakness. Nothing aggravates the symptoms. She has tried acetaminophen for the symptoms. The treatment provided mild relief.   RN Note: Headache since 8 pm. Dizziness x 2 days.  Blood pressure at home was 120/95.  Took Tylenol, headache calmed down a little. No bleeding. No leaking.  Baby moving  Past Medical History: Past Medical History:  Diagnosis Date  . Anemia   . Blood transfusion without reported diagnosis   . GERD (gastroesophageal reflux disease)    tums prn  . Incompetent cervix   . 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 APPENDECTOMY  04/08/2011  . CERVICAL CERCLAGE     x2  . CERVICAL CERCLAGE  04/19/2012   Procedure: CERCLAGE CERVICAL;  Surgeon: Meriel Picaichard M Holland, MD;  Location: [facility];  Service: Gynecology;  Laterality: N/A;  . CERVICAL CERCLAGE N/A 10/14/2015   Procedure: CERCLAGE CERVICAL;  Surgeon: Richarda Overlieichard Holland, MD;  Location: [facility];  Service: Gynecology;  Laterality: N/A;  . CESAREAN SECTION  03/07, 04/09, 12/11  . CESAREAN SECTION    . CESAREAN SECTION    . DILATION AND CURETTAGE OF UTERUS  05/23/2012   Procedure: DILATATION AND CURETTAGE;  Surgeon: Leslie AndreaJames E Tomblin II, MD;  Location: [facility];  Service: Gynecology;  Laterality: N/A;    Family History: Family History  Problem Relation Age of Onset  . Hypertension Mother   . Hypertension Father     Social History: Social History  Substance Use Topics  . Smoking status: Never Smoker  . Smokeless tobacco: Never Used  . Alcohol use No    Allergies:  Allergies  Allergen Reactions  . Contrast Media [Iodinated Diagnostic Agents] Hives    Meds:  Prescriptions Prior to Admission  Medication Sig Dispense Refill Last Dose  . acetaminophen (TYLENOL) 325 MG tablet Take 650 mg by mouth every 6 (six) hours as needed for mild pain or headache.   04/01/2016 at 0100  . ferrous sulfate 325 (65 FE) MG tablet Take 325 mg by mouth daily with breakfast.   03/31/2016 at Unknown time  . Prenatal Vit-Fe Fumarate-FA (PRENATAL MULTIVITAMIN) TABS tablet Take 1 tablet by mouth daily at 12 noon.   03/31/2016 at Unknown time  . progesterone (PROMETRIUM) 200 MG capsule Place 1 capsule vaginally at bedtime.    03/31/2016 at Unknown time    I have reviewed patient's Past Medical Hx, Surgical Hx, Family Hx,  Social Hx, medications and allergies.   ROS:  Review of Systems  Constitutional: Negative for fever.  Eyes: Negative for blurred vision and photophobia.  Gastrointestinal: Positive for abdominal pain. Negative for nausea and vomiting.  Musculoskeletal: Negative for back pain and neck pain.  Neurological: Positive for dizziness and headaches. Negative for weakness.   Other systems negative  Physical Exam  Patient Vitals for the past 24 hrs:  BP Temp Temp src Pulse Resp SpO2  04/01/16 0543 - - - 110 - 97 %  04/01/16 0538 - - - 109 - 98 %  04/01/16 0533 - - - 112 - 98 %  04/01/16 0528 - - - 113 - 99 %  04/01/16 0523 - - - 111 - 98 %  04/01/16 0519 112/76 98.4 F (36.9 C) Oral (!) 121 16 -  04/01/16 0518 - - - 118 - 99 %  113/77 111/74 112/76  Constitutional: Well-developed, well-nourished female in no acute distress.  Cardiovascular: normal rate and rhythm Respiratory: normal effort, clear to auscultation bilaterally GI: Abd soft, non-tender, gravid appropriate for gestational age.   No rebound or guarding. MS: Extremities nontender, Trace to 1+ pedal edema, normal ROM Neurologic: Alert and oriented x 4.   DTRs 2+3+ with no clonus GU: Neg CVAT.   FHT:  Baseline 140 , moderate variability, accelerations present, no decelerations Contractions: Irregular     Labs:   Ref. Range 04/01/2016 05:05  Appearance Latest Ref Range: CLEAR  CLEAR  Bacteria, UA Latest Ref Range: NONE SEEN  RARE (A)  Bilirubin Urine Latest Ref Range: NEGATIVE  NEGATIVE  Color, Urine Latest Ref Range: YELLOW  YELLOW  Glucose Latest Ref Range: NEGATIVE mg/dL NEGATIVE  Hgb urine dipstick Latest Ref Range: NEGATIVE  LARGE (A)  Ketones, ur Latest Ref Range: NEGATIVE mg/dL NEGATIVE  Leukocytes, UA Latest Ref Range: NEGATIVE  NEGATIVE  Nitrite Latest Ref Range: NEGATIVE  NEGATIVE  pH Latest Ref Range: 5.0 - 8.0  6.0  Protein Latest Ref Range: NEGATIVE mg/dL 30 (A)  RBC / HPF Latest Ref Range: 0 - 5 RBC/hpf  0-5  Specific Gravity, Urine Latest Ref Range: 1.005 - 1.030  <1.005 (L)  Squamous Epithelial / LPF Latest Ref Range: NONE SEEN  6-30 (A)  WBC, UA Latest Ref Range: 0 - 5 WBC/hpf 0-5   Imaging:  No results found.  MAU Course/MDM: I have ordered labs and reviewed results. Urine has 30mg  protein NST reviewed Consult Dr Marcelle OverlieHolland with presentation, exam findings and test results.   WIll discharge home with expectant management and followup in office this week  Assessment: SIUP at 495w5d Headache, possibly tension type Normotensive  ? Labile hypertension, though cannot verify home BPs  Plan:  Discharge home Preeclampsia precautions reviewed Labor precautions and fetal kick counts Follow up in Office for prenatal visits and recheck of BP if necessary Has C/S scheduled for this week with DR [name]   Pt stable at time of discharge.  Encouraged to return here or to other Urgent Care/ED if she develops worsening of symptoms, increase in pain, fever, or other concerning symptoms.      Wynelle Bourgeois CNM, MSN Certified Nurse-Midwife 04/01/2016 5:57 AM

## 2016-04-01 NOTE — Discharge Instructions (Signed)
Tension Headache A tension headache is pain, pressure, or aching that is felt over the front and sides of your head. These headaches can last from 30 minutes to several days. HOME CARE Managing Pain  Take over-the-counter and prescription medicines only as told by your doctor.  Lie down in a dark, quiet room when you have a headache.  If directed, apply ice to your head and neck area:  Put ice in a plastic bag.  Place a towel between your skin and the bag.  Leave the ice on for 20 minutes, 2-3 times per day.  Use a heating pad or a hot shower to apply heat to your head and neck area as told by your doctor. Eating and Drinking  Eat meals on a regular schedule.  Do not drink a lot of alcohol.  Do not use a lot of caffeine, or stop using caffeine. General Instructions  Keep all follow-up visits as told by your doctor. This is important.  Keep a journal to find out if certain things bring on headaches. For example, write down:  What you eat and drink.  How much sleep you get.  Any change to your diet or medicines.  Try getting a massage, or doing other things that help you to relax.  Lessen stress.  Sit up straight. Do not tighten (tense) your muscles.  Do not use tobacco products. This includes cigarettes, chewing tobacco, or e-cigarettes. If you need help quitting, ask your doctor.  Exercise regularly as told by your doctor.  Get enough sleep. This may mean 7-9 hours of sleep. GET HELP IF:  Your symptoms are not helped by medicine.  You have a headache that feels different from your usual headache.  You feel sick to your stomach (nauseous) or you throw up (vomit).  You have a fever. GET HELP RIGHT AWAY IF:  Your headache becomes very bad.  You keep throwing up.  You have a stiff neck.  You have trouble seeing.  You have trouble speaking.  You have pain in your eye or ear.  Your muscles are weak or you lose muscle control.  You lose your balance  or you have trouble walking.  You feel like you will pass out (faint) or you pass out.  You have confusion.   This information is not intended to replace advice given to you by your health care provider. Make sure you discuss any questions you have with your health care provider.   Document Released: 10/25/2009 Document Revised: 04/21/2015 Document Reviewed: 11/23/2014 Elsevier Interactive Patient Education [employer] Third Trimester of Pregnancy The third trimester is from week 29 through week 42, months 7 through 9. The third trimester is a time when the fetus is growing rapidly. At the end of the ninth month, the fetus is about 20 inches in length and weighs 6-10 pounds.  BODY CHANGES Your body goes through many changes during pregnancy. The changes vary from woman to woman.   Your weight will continue to increase. You can expect to gain 25-35 pounds (11-16 kg) by the end of the pregnancy.  You may begin to get stretch marks on your hips, abdomen, and breasts.  You may urinate more often because the fetus is moving lower into your pelvis and pressing on your bladder.  You may develop or continue to have heartburn as a result of your pregnancy.  You may develop constipation because certain hormones are causing the muscles that push waste through your intestines to slow  down.  You may develop hemorrhoids or swollen, bulging veins (varicose veins).  You may have pelvic pain because of the weight gain and pregnancy hormones relaxing your joints between the bones in your pelvis. Backaches may result from overexertion of the muscles supporting your posture.  You may have changes in your hair. These can include thickening of your hair, rapid growth, and changes in texture. Some women also have hair loss during or after pregnancy, or hair that feels dry or thin. Your hair will most likely return to normal after your baby is born.  Your breasts will continue to grow and be tender. A  yellow discharge may leak from your breasts called colostrum.  Your belly button may stick out.  You may feel short of breath because of your expanding uterus.  You may notice the fetus "dropping," or moving lower in your abdomen.  You may have a bloody mucus discharge. This usually occurs a few days to a week before labor begins.  Your cervix becomes thin and soft (effaced) near your due date. WHAT TO EXPECT AT YOUR PRENATAL EXAMS  You will have prenatal exams every 2 weeks until week 36. Then, you will have weekly prenatal exams. During a routine prenatal visit:  You will be weighed to make sure you and the fetus are growing normally.  Your blood pressure is taken.  Your abdomen will be measured to track your baby's growth.  The fetal heartbeat will be listened to.  Any test results from the previous visit will be discussed.  You may have a cervical check near your due date to see if you have effaced. At around 36 weeks, your caregiver will check your cervix. At the same time, your caregiver will also perform a test on the secretions of the vaginal tissue. This test is to determine if a type of bacteria, Group B streptococcus, is present. Your caregiver will explain this further. Your caregiver may ask you:  What your birth plan is.  How you are feeling.  If you are feeling the baby move.  If you have had any abnormal symptoms, such as leaking fluid, bleeding, severe headaches, or abdominal cramping.  If you are using any tobacco products, including cigarettes, chewing tobacco, and electronic cigarettes.  If you have any questions. Other tests or screenings that may be performed during your third trimester include:  Blood tests that check for low iron levels (anemia).  Fetal testing to check the health, activity level, and growth of the fetus. Testing is done if you have certain medical conditions or if there are problems during the pregnancy.  HIV (human  immunodeficiency virus) testing. If you are at high risk, you may be screened for HIV during your third trimester of pregnancy. FALSE LABOR You may feel small, irregular contractions that eventually go away. These are called Braxton Hicks contractions, or false labor. Contractions may last for hours, days, or even weeks before true labor sets in. If contractions come at regular intervals, intensify, or become painful, it is best to be seen by your caregiver.  SIGNS OF LABOR   Menstrual-like cramps.  Contractions that are 5 minutes apart or less.  Contractions that start on the top of the uterus and spread down to the lower abdomen and back.  A sense of increased pelvic pressure or back pain.  A watery or bloody mucus discharge that comes from the vagina. If you have any of these signs before the 37th week of pregnancy, call your caregiver  right away. You need to go to the hospital to get checked immediately. HOME CARE INSTRUCTIONS   Avoid all smoking, herbs, alcohol, and unprescribed drugs. These chemicals affect the formation and growth of the baby.  Do not use any tobacco products, including cigarettes, chewing tobacco, and electronic cigarettes. If you need help quitting, ask your health care provider. You may receive counseling support and other resources to help you quit.  Follow your caregiver's instructions regarding medicine use. There are medicines that are either safe or unsafe to take during pregnancy.  Exercise only as directed by your caregiver. Experiencing uterine cramps is a good sign to stop exercising.  Continue to eat regular, healthy meals.  Wear a good support bra for breast tenderness.  Do not use hot tubs, steam rooms, or saunas.  Wear your seat belt at all times when driving.  Avoid raw meat, uncooked cheese, cat litter boxes, and soil used by cats. These carry germs that can cause birth defects in the baby.  Take your prenatal vitamins.  Take 1500-2000 mg  of calcium daily starting at the 20th week of pregnancy until you deliver your baby.  Try taking a stool softener (if your caregiver approves) if you develop constipation. Eat more high-fiber foods, such as fresh vegetables or fruit and whole grains. Drink plenty of fluids to keep your urine clear or pale yellow.  Take warm sitz baths to soothe any pain or discomfort caused by hemorrhoids. Use hemorrhoid cream if your caregiver approves.  If you develop varicose veins, wear support hose. Elevate your feet for 15 minutes, 3-4 times a day. Limit salt in your diet.  Avoid heavy lifting, wear low heal shoes, and practice good posture.  Rest a lot with your legs elevated if you have leg cramps or low back pain.  Visit your dentist if you have not gone during your pregnancy. Use a soft toothbrush to brush your teeth and be gentle when you floss.  A sexual relationship may be continued unless your caregiver directs you otherwise.  Do not travel far distances unless it is absolutely necessary and only with the approval of your caregiver.  Take prenatal classes to understand, practice, and ask questions about the labor and delivery.  Make a trial run to the hospital.  Pack your hospital bag.  Prepare the baby's nursery.  Continue to go to all your prenatal visits as directed by your caregiver. SEEK MEDICAL CARE IF:  You are unsure if you are in labor or if your water has broken.  You have dizziness.  You have mild pelvic cramps, pelvic pressure, or nagging pain in your abdominal area.  You have persistent nausea, vomiting, or diarrhea.  You have a bad smelling vaginal discharge.  You have pain with urination. SEEK IMMEDIATE MEDICAL CARE IF:   You have a fever.  You are leaking fluid from your vagina.  You have spotting or bleeding from your vagina.  You have severe abdominal cramping or pain.  You have rapid weight loss or gain.  You have shortness of breath with chest  pain.  You notice sudden or extreme swelling of your face, hands, ankles, feet, or legs.  You have not felt your baby move in over an hour.  You have severe headaches that do not go away with medicine.  You have vision changes.   This information is not intended to replace advice given to you by your health care provider. Make sure you discuss any questions you have with  your health care provider.   Document Released: 07/25/2001 Document Revised: 08/21/2014 Document Reviewed: 10/01/2012 Elsevier Interactive Patient Education [employer]

## 2016-04-05 ENCOUNTER — Encounter (HOSPITAL_COMMUNITY)
Admission: RE | Admit: 2016-04-05 | Discharge: 2016-04-05 | Disposition: A | Discharge status: Home / Self Care | Payer: BLUE CROSS/BLUE SHIELD | Source: Ambulatory Visit | Attending: Obstetrics and Gynecology | Admitting: Obstetrics and Gynecology

## 2016-04-05 HISTORY — DX: Adverse effect of other bacterial vaccines, initial encounter: T50.A95A

## 2016-04-05 HISTORY — DX: Nonspecific reaction to tuberculin skin test without active tuberculosis: R76.11

## 2016-04-05 LAB — CBC
HCT: 32.6 % — ABNORMAL LOW (ref 36.0–46.0)
Hemoglobin: 11.3 g/dL — ABNORMAL LOW (ref 12.0–15.0)
MCH: 26.3 pg (ref 26.0–34.0)
MCHC: 34.7 g/dL (ref 30.0–36.0)
MCV: 76 fL — AB (ref 78.0–100.0)
PLATELETS: 326 10*3/uL (ref 150–400)
RBC: 4.29 MIL/uL (ref 3.87–5.11)
RDW: 19.8 % — ABNORMAL HIGH (ref 11.5–15.5)
WBC: 8.1 10*3/uL (ref 4.0–10.5)

## 2016-04-05 MED ORDER — DEXTROSE 5 % IV SOLN
2.0000 g | INTRAVENOUS | Status: AC
  Administered 2016-04-06: 2 g via INTRAVENOUS
  Filled 2016-04-05: qty 2

## 2016-04-05 NOTE — Anesthesia Preprocedure Evaluation (Addendum)
Anesthesia Evaluation  Patient identified by MRN, date of birth, ID band Patient awake    Reviewed: Allergy & Precautions, NPO status , Patient's Chart, lab work & pertinent test results  History of Anesthesia Complications Negative for: history of anesthetic complications  Airway Mallampati: III  TM Distance: >3 FB Neck ROM: Full    Dental  (+) Teeth Intact, Dental Advisory Given   Pulmonary neg pulmonary ROS, neg shortness of breath, neg sleep apnea, neg COPD, neg recent URI,    Pulmonary exam normal breath sounds clear to auscultation       Cardiovascular negative cardio ROS   Rhythm:Regular Rate:Normal  Denies symptoms of supine hypotension   Neuro/Psych neg Seizures    GI/Hepatic Neg liver ROS, GERD  ,  Endo/Other  negative endocrine ROS  Renal/GU negative Renal ROS     Musculoskeletal   Abdominal (+) + obese,   Peds  Hematology  (+) Blood dyscrasia, anemia ,   Anesthesia Other Findings 35 y.o. [id number] with h/o previous c-section x3 presents for repeat c-section, BTL, and cerclage removal  Reproductive/Obstetrics (+) Pregnancy (incompetent cervix)                            Anesthesia Physical Anesthesia Plan  ASA: II  Anesthesia Plan: Spinal   Post-op Pain Management:    Induction:   Airway Management Planned: Natural Airway  Additional Equipment:   Intra-op Plan:   Post-operative Plan:   Informed Consent: I have reviewed the patients History and Physical, chart, labs and discussed the procedure including the risks, benefits and alternatives for the proposed anesthesia with the patient or authorized representative who has indicated his/her understanding and acceptance.   Dental advisory given  Plan Discussed with:   Anesthesia Plan Comments: (I have discussed risks of neuraxial anesthesia including but not limited to infection, bleeding, nerve injury, back pain,  headache, seizures, and failure of block. Patient denies bleeding disorders and is not currently anticoagulated. Labs have been reviewed. Risks and benefits discussed. All patient's questions answered.   Hgb 11.3 Hct 32.6 Platelets 326)       Anesthesia Quick Evaluation

## 2016-04-05 NOTE — Patient Instructions (Signed)
20 Dutch GrayJessy B Harlacher  04/05/2016   Your procedure is scheduled on:  04/06/2016  [facility].  Pick up the phone at the desk and dial [phone].   Call this number if you have problems the morning of surgery: [phone]   Remember:   Do not eat food:After Midnight.  Do not drink clear liquids: After Midnight.  Take these medicines the morning of surgery with A SIP OF WATER: none   Do not wear jewelry, make-up or nail polish.  Do not wear lotions, powders, or perfumes. Do not wear deodorant.  Do not shave 48 hours prior to surgery.  Do not bring valuables to the hospital.  [facility] is not   responsible for any belongings or valuables brought to the hospital.  Contacts, dentures or bridgework may not be worn into surgery.  Leave suitcase in the car. After surgery it may be brought to your room.  For patients admitted to the hospital, checkout time is 11:00 AM the day of              discharge.   Patients discharged the day of surgery will not be allowed to drive             home.  Name and phone number of your driver na  Special Instructions:   N/A   Please read over the following fact sheets that you were given:   Surgical Site Infection Prevention

## 2016-04-06 ENCOUNTER — Encounter (HOSPITAL_COMMUNITY): Payer: Self-pay

## 2016-04-06 ENCOUNTER — Encounter (HOSPITAL_COMMUNITY)
Admission: RE | Disposition: A | Discharge status: Home / Self Care | Payer: Self-pay | Source: Ambulatory Visit | Attending: Obstetrics and Gynecology

## 2016-04-06 ENCOUNTER — Inpatient Hospital Stay (HOSPITAL_COMMUNITY)
Admission: RE | Admit: 2016-04-06 | Discharge: 2016-04-08 | DRG: 765 | Disposition: A | Discharge status: Home / Self Care | Payer: BLUE CROSS/BLUE SHIELD | Source: Ambulatory Visit | Attending: Obstetrics and Gynecology | Admitting: Obstetrics and Gynecology

## 2016-04-06 ENCOUNTER — Inpatient Hospital Stay (HOSPITAL_COMMUNITY): Payer: BLUE CROSS/BLUE SHIELD | Admitting: Anesthesiology

## 2016-04-06 DIAGNOSIS — O99213 Obesity complicating pregnancy, third trimester: Secondary | ICD-10-CM | POA: Diagnosis present

## 2016-04-06 DIAGNOSIS — Z302 Encounter for sterilization: Secondary | ICD-10-CM | POA: Diagnosis not present

## 2016-04-06 DIAGNOSIS — O9902 Anemia complicating childbirth: Secondary | ICD-10-CM | POA: Diagnosis present

## 2016-04-06 DIAGNOSIS — N96 Recurrent pregnancy loss: Secondary | ICD-10-CM | POA: Diagnosis present

## 2016-04-06 DIAGNOSIS — O34211 Maternal care for low transverse scar from previous cesarean delivery: Principal | ICD-10-CM | POA: Diagnosis present

## 2016-04-06 DIAGNOSIS — Z6838 Body mass index (BMI) 38.0-38.9, adult: Secondary | ICD-10-CM

## 2016-04-06 DIAGNOSIS — Z3A39 39 weeks gestation of pregnancy: Secondary | ICD-10-CM

## 2016-04-06 DIAGNOSIS — O3433 Maternal care for cervical incompetence, third trimester: Secondary | ICD-10-CM | POA: Diagnosis present

## 2016-04-06 DIAGNOSIS — Z3490 Encounter for supervision of normal pregnancy, unspecified, unspecified trimester: Secondary | ICD-10-CM

## 2016-04-06 LAB — RPR: RPR Ser Ql: NONREACTIVE

## 2016-04-06 LAB — PREPARE RBC (CROSSMATCH)

## 2016-04-06 SURGERY — Surgical Case
Anesthesia: Spinal | Laterality: Bilateral

## 2016-04-06 MED ORDER — SODIUM CHLORIDE 0.9% FLUSH: 3.0000 mL | Freq: Two times a day (BID) | INTRAVENOUS | Status: DC

## 2016-04-06 MED ORDER — ONDANSETRON HCL 4 MG/2ML IJ SOLN
INTRAMUSCULAR | Status: DC | PRN
  Administered 2016-04-06: 4 mg via INTRAVENOUS

## 2016-04-06 MED ORDER — ACETAMINOPHEN 325 MG PO TABS: 650.0000 mg | ORAL_TABLET | ORAL | Status: DC | PRN

## 2016-04-06 MED ORDER — OXYTOCIN 10 UNIT/ML IJ SOLN
INTRAMUSCULAR | Status: AC
  Filled 2016-04-06: qty 4

## 2016-04-06 MED ORDER — PHENYLEPHRINE 8 MG IN D5W 100 ML (0.08MG/ML) PREMIX OPTIME
INJECTION | INTRAVENOUS | Status: DC | PRN
  Administered 2016-04-06: 60 ug/min via INTRAVENOUS

## 2016-04-06 MED ORDER — SENNOSIDES-DOCUSATE SODIUM 8.6-50 MG PO TABS: 2.0000 | ORAL_TABLET | ORAL | Status: DC

## 2016-04-06 MED ORDER — SODIUM CHLORIDE 0.9% FLUSH: 3.0000 mL | INTRAVENOUS | Status: DC | PRN

## 2016-04-06 MED ORDER — NALBUPHINE HCL 10 MG/ML IJ SOLN: 5.0000 mg | Freq: Once | INTRAMUSCULAR | Status: DC | PRN

## 2016-04-06 MED ORDER — SODIUM CHLORIDE 0.9 % IV SOLN: 250.0000 mL | INTRAVENOUS | Status: DC

## 2016-04-06 MED ORDER — WITCH HAZEL-GLYCERIN EX PADS: 1.0000 "application " | MEDICATED_PAD | CUTANEOUS | Status: DC | PRN

## 2016-04-06 MED ORDER — OXYCODONE-ACETAMINOPHEN 5-325 MG PO TABS: 1.0000 | ORAL_TABLET | ORAL | Status: DC | PRN

## 2016-04-06 MED ORDER — COCONUT OIL OIL: 1.0000 "application " | TOPICAL_OIL | Status: DC | PRN

## 2016-04-06 MED ORDER — SIMETHICONE 80 MG PO CHEW: 80.0000 mg | CHEWABLE_TABLET | ORAL | Status: DC | PRN

## 2016-04-06 MED ORDER — MORPHINE SULFATE (PF) 0.5 MG/ML IJ SOLN
INTRAMUSCULAR | Status: AC
  Filled 2016-04-06: qty 10

## 2016-04-06 MED ORDER — TETANUS-DIPHTH-ACELL PERTUSSIS 5-2.5-18.5 LF-MCG/0.5 IM SUSP: 0.5000 mL | Freq: Once | INTRAMUSCULAR | Status: DC

## 2016-04-06 MED ORDER — ONDANSETRON HCL 4 MG/2ML IJ SOLN
INTRAMUSCULAR | Status: AC
  Filled 2016-04-06: qty 2

## 2016-04-06 MED ORDER — SCOPOLAMINE 1 MG/3DAYS TD PT72
MEDICATED_PATCH | TRANSDERMAL | Status: AC
  Administered 2016-04-06: 1.5 mg via TRANSDERMAL
  Filled 2016-04-06: qty 1

## 2016-04-06 MED ORDER — DIBUCAINE 1 % RE OINT: 1.0000 "application " | TOPICAL_OINTMENT | RECTAL | Status: DC | PRN

## 2016-04-06 MED ORDER — BISACODYL 10 MG RE SUPP: 10.0000 mg | Freq: Every day | RECTAL | Status: DC | PRN

## 2016-04-06 MED ORDER — SOD CITRATE-CITRIC ACID 500-334 MG/5ML PO SOLN
30.0000 mL | Freq: Once | ORAL | Status: AC
  Administered 2016-04-06: 30 mL via ORAL

## 2016-04-06 MED ORDER — NALBUPHINE HCL 10 MG/ML IJ SOLN: 5.0000 mg | INTRAMUSCULAR | Status: DC | PRN

## 2016-04-06 MED ORDER — MORPHINE SULFATE (PF) 0.5 MG/ML IJ SOLN
INTRAMUSCULAR | Status: DC | PRN
  Administered 2016-04-06: .1 mg via INTRATHECAL

## 2016-04-06 MED ORDER — DIPHENHYDRAMINE HCL 25 MG PO CAPS: 25.0000 mg | ORAL_CAPSULE | ORAL | Status: DC | PRN

## 2016-04-06 MED ORDER — OXYCODONE-ACETAMINOPHEN 5-325 MG PO TABS
1.0000 | ORAL_TABLET | ORAL | Status: DC | PRN
  Administered 2016-04-07: 1 via ORAL
  Filled 2016-04-06: qty 1

## 2016-04-06 MED ORDER — LACTATED RINGERS IV SOLN
INTRAVENOUS | Status: DC
  Administered 2016-04-06: 1000 mL/h via INTRAVENOUS
  Administered 2016-04-06 (×2): via INTRAVENOUS

## 2016-04-06 MED ORDER — IBUPROFEN 800 MG PO TABS
800.0000 mg | ORAL_TABLET | Freq: Three times a day (TID) | ORAL | Status: DC | PRN
Start: 2016-04-06 — End: 2016-04-08
  Administered 2016-04-07 – 2016-04-08 (×4): 800 mg via ORAL
  Filled 2016-04-06 (×4): qty 1

## 2016-04-06 MED ORDER — LACTATED RINGERS IV SOLN: INTRAVENOUS | Status: DC

## 2016-04-06 MED ORDER — DIPHENHYDRAMINE HCL 25 MG PO CAPS: 25.0000 mg | ORAL_CAPSULE | Freq: Four times a day (QID) | ORAL | Status: DC | PRN

## 2016-04-06 MED ORDER — MENTHOL 3 MG MT LOZG: 1.0000 | LOZENGE | OROMUCOSAL | Status: DC | PRN

## 2016-04-06 MED ORDER — FENTANYL CITRATE (PF) 100 MCG/2ML IJ SOLN
INTRAMUSCULAR | Status: AC
  Filled 2016-04-06: qty 2

## 2016-04-06 MED ORDER — OXYCODONE-ACETAMINOPHEN 5-325 MG PO TABS
2.0000 | ORAL_TABLET | ORAL | Status: DC | PRN
  Administered 2016-04-07: 2 via ORAL
  Filled 2016-04-06: qty 2

## 2016-04-06 MED ORDER — SIMETHICONE 80 MG PO CHEW
80.0000 mg | CHEWABLE_TABLET | Freq: Three times a day (TID) | ORAL | Status: DC
  Administered 2016-04-07 – 2016-04-08 (×4): 80 mg via ORAL
  Filled 2016-04-06 (×4): qty 1

## 2016-04-06 MED ORDER — OXYTOCIN 40 UNITS IN LACTATED RINGERS INFUSION - SIMPLE MED: 2.5000 [IU]/h | INTRAVENOUS | Status: DC

## 2016-04-06 MED ORDER — FLEET ENEMA 7-19 GM/118ML RE ENEM: 1.0000 | ENEMA | Freq: Every day | RECTAL | Status: DC | PRN

## 2016-04-06 MED ORDER — DEXTROSE IN LACTATED RINGERS 5 % IV SOLN: INTRAVENOUS | Status: DC

## 2016-04-06 MED ORDER — PHENYLEPHRINE 8 MG IN D5W 100 ML (0.08MG/ML) PREMIX OPTIME
INJECTION | INTRAVENOUS | Status: AC
  Filled 2016-04-06: qty 100

## 2016-04-06 MED ORDER — PRENATAL MULTIVITAMIN CH: 1.0000 | ORAL_TABLET | Freq: Every day | ORAL | Status: DC

## 2016-04-06 MED ORDER — SIMETHICONE 80 MG PO CHEW: 80.0000 mg | CHEWABLE_TABLET | Freq: Three times a day (TID) | ORAL | Status: DC

## 2016-04-06 MED ORDER — IBUPROFEN 800 MG PO TABS: 800.0000 mg | ORAL_TABLET | Freq: Three times a day (TID) | ORAL | Status: DC | PRN

## 2016-04-06 MED ORDER — NALBUPHINE HCL 10 MG/ML IJ SOLN
5.0000 mg | INTRAMUSCULAR | Status: DC | PRN
  Administered 2016-04-06 – 2016-04-07 (×2): 5 mg via INTRAVENOUS
  Filled 2016-04-06: qty 1

## 2016-04-06 MED ORDER — NALOXONE HCL 0.4 MG/ML IJ SOLN: 0.4000 mg | INTRAMUSCULAR | Status: DC | PRN

## 2016-04-06 MED ORDER — PROMETHAZINE HCL 25 MG/ML IJ SOLN: 6.2500 mg | INTRAMUSCULAR | Status: DC | PRN

## 2016-04-06 MED ORDER — ZOLPIDEM TARTRATE 5 MG PO TABS: 5.0000 mg | ORAL_TABLET | Freq: Every evening | ORAL | Status: DC | PRN

## 2016-04-06 MED ORDER — SODIUM CHLORIDE 0.9 % IR SOLN
Status: DC | PRN
  Administered 2016-04-06: 1

## 2016-04-06 MED ORDER — KETOROLAC TROMETHAMINE 30 MG/ML IJ SOLN
30.0000 mg | Freq: Four times a day (QID) | INTRAMUSCULAR | Status: AC | PRN
  Administered 2016-04-06: 30 mg via INTRAMUSCULAR

## 2016-04-06 MED ORDER — KETOROLAC TROMETHAMINE 30 MG/ML IJ SOLN
INTRAMUSCULAR | Status: AC
  Administered 2016-04-06: 30 mg via INTRAVENOUS
  Filled 2016-04-06: qty 1

## 2016-04-06 MED ORDER — KETOROLAC TROMETHAMINE 30 MG/ML IJ SOLN
30.0000 mg | Freq: Four times a day (QID) | INTRAMUSCULAR | Status: AC | PRN
  Administered 2016-04-06 (×2): 30 mg via INTRAVENOUS
  Filled 2016-04-06 (×2): qty 1

## 2016-04-06 MED ORDER — BUPIVACAINE IN DEXTROSE 0.75-8.25 % IT SOLN
INTRATHECAL | Status: DC | PRN
  Administered 2016-04-06: 12 mg via INTRATHECAL

## 2016-04-06 MED ORDER — DIPHENHYDRAMINE HCL 50 MG/ML IJ SOLN: 12.5000 mg | INTRAMUSCULAR | Status: DC | PRN

## 2016-04-06 MED ORDER — OXYTOCIN 10 UNIT/ML IJ SOLN
INTRAVENOUS | Status: DC | PRN
  Administered 2016-04-06: 40 [IU] via INTRAVENOUS

## 2016-04-06 MED ORDER — MEASLES, MUMPS & RUBELLA VAC ~~LOC~~ INJ: 0.5000 mL | INJECTION | Freq: Once | SUBCUTANEOUS | Status: DC

## 2016-04-06 MED ORDER — ONDANSETRON HCL 4 MG/2ML IJ SOLN: 4.0000 mg | Freq: Three times a day (TID) | INTRAMUSCULAR | Status: DC | PRN

## 2016-04-06 MED ORDER — SENNOSIDES-DOCUSATE SODIUM 8.6-50 MG PO TABS
2.0000 | ORAL_TABLET | ORAL | Status: DC
  Administered 2016-04-06 – 2016-04-07 (×2): 2 via ORAL
  Filled 2016-04-06 (×2): qty 2

## 2016-04-06 MED ORDER — SIMETHICONE 80 MG PO CHEW
80.0000 mg | CHEWABLE_TABLET | ORAL | Status: DC
  Administered 2016-04-06 – 2016-04-07 (×2): 80 mg via ORAL
  Filled 2016-04-06 (×2): qty 1

## 2016-04-06 MED ORDER — FENTANYL CITRATE (PF) 100 MCG/2ML IJ SOLN
INTRAMUSCULAR | Status: DC | PRN
  Administered 2016-04-06: 15 ug via INTRATHECAL

## 2016-04-06 MED ORDER — MEPERIDINE HCL 25 MG/ML IJ SOLN: 6.2500 mg | INTRAMUSCULAR | Status: DC | PRN

## 2016-04-06 MED ORDER — PRENATAL MULTIVITAMIN CH
1.0000 | ORAL_TABLET | Freq: Every day | ORAL | Status: DC
  Administered 2016-04-07: 1 via ORAL
  Filled 2016-04-06: qty 1

## 2016-04-06 MED ORDER — SOD CITRATE-CITRIC ACID 500-334 MG/5ML PO SOLN
ORAL | Status: AC
  Filled 2016-04-06: qty 15

## 2016-04-06 MED ORDER — NALOXONE HCL 2 MG/2ML IJ SOSY
1.0000 ug/kg/h | PREFILLED_SYRINGE | INTRAVENOUS | Status: DC | PRN
  Filled 2016-04-06: qty 2

## 2016-04-06 MED ORDER — OXYCODONE-ACETAMINOPHEN 5-325 MG PO TABS: 2.0000 | ORAL_TABLET | ORAL | Status: DC | PRN

## 2016-04-06 MED ORDER — OXYTOCIN 40 UNITS IN LACTATED RINGERS INFUSION - SIMPLE MED: 2.5000 [IU]/h | INTRAVENOUS | Status: AC

## 2016-04-06 MED ORDER — SIMETHICONE 80 MG PO CHEW: 80.0000 mg | CHEWABLE_TABLET | ORAL | Status: DC

## 2016-04-06 MED ORDER — FENTANYL CITRATE (PF) 100 MCG/2ML IJ SOLN
25.0000 ug | INTRAMUSCULAR | Status: DC | PRN
  Administered 2016-04-06 (×2): 25 ug via INTRAVENOUS

## 2016-04-06 MED ORDER — SCOPOLAMINE 1 MG/3DAYS TD PT72
1.0000 | MEDICATED_PATCH | Freq: Once | TRANSDERMAL | Status: DC
  Administered 2016-04-06: 1.5 mg via TRANSDERMAL

## 2016-04-06 MED ORDER — MEASLES, MUMPS & RUBELLA VAC ~~LOC~~ INJ
0.5000 mL | INJECTION | Freq: Once | SUBCUTANEOUS | Status: DC
  Filled 2016-04-06: qty 0.5

## 2016-04-06 MED ORDER — DEXTROSE IN LACTATED RINGERS 5 % IV SOLN
INTRAVENOUS | Status: DC
  Administered 2016-04-06 (×2): via INTRAVENOUS

## 2016-04-06 MED ORDER — SCOPOLAMINE 1 MG/3DAYS TD PT72
1.0000 | MEDICATED_PATCH | Freq: Once | TRANSDERMAL | Status: DC
  Filled 2016-04-06: qty 1

## 2016-04-06 SURGICAL SUPPLY — 34 items
BENZOIN TINCTURE PRP APPL 2/3 (GAUZE/BANDAGES/DRESSINGS) ×3 IMPLANT
CHLORAPREP W/TINT 26ML (MISCELLANEOUS) ×3 IMPLANT
CLAMP CORD UMBIL (MISCELLANEOUS) IMPLANT
CLIP FILSHIE TUBAL LIGA STRL (Clip) ×6 IMPLANT
CLOSURE STERI STRIP 1/2 X4 (GAUZE/BANDAGES/DRESSINGS) ×2 IMPLANT
CLOSURE WOUND 1/2 X4 (GAUZE/BANDAGES/DRESSINGS) ×1
CLOTH BEACON ORANGE TIMEOUT ST (SAFETY) ×3 IMPLANT
DRSG OPSITE POSTOP 4X10 (GAUZE/BANDAGES/DRESSINGS) ×3 IMPLANT
ELECT REM PT RETURN 9FT ADLT (ELECTROSURGICAL) ×3
ELECTRODE REM PT RTRN 9FT ADLT (ELECTROSURGICAL) ×1 IMPLANT
EXTRACTOR VACUUM M CUP 4 TUBE (SUCTIONS) IMPLANT
EXTRACTOR VACUUM M CUP 4' TUBE (SUCTIONS)
GLOVE BIO SURGEON STRL SZ7 (GLOVE) ×3 IMPLANT
GLOVE BIOGEL PI IND STRL 7.0 (GLOVE) ×2 IMPLANT
GLOVE BIOGEL PI INDICATOR 7.0 (GLOVE) ×4
GOWN STRL REUS W/TWL LRG LVL3 (GOWN DISPOSABLE) ×6 IMPLANT
KIT ABG SYR 3ML LUER SLIP (SYRINGE) IMPLANT
NEEDLE HYPO 25X5/8 SAFETYGLIDE (NEEDLE) ×3 IMPLANT
NS IRRIG 1000ML POUR BTL (IV SOLUTION) ×3 IMPLANT
PACK C SECTION WH (CUSTOM PROCEDURE TRAY) ×3 IMPLANT
PAD ABD 7.5X8 STRL (GAUZE/BANDAGES/DRESSINGS) ×6 IMPLANT
PAD OB MATERNITY 4.3X12.25 (PERSONAL CARE ITEMS) ×3 IMPLANT
PENCIL SMOKE EVAC W/HOLSTER (ELECTROSURGICAL) ×3 IMPLANT
SPONGE DRAIN TRACH 4X4 STRL 2S (GAUZE/BANDAGES/DRESSINGS) ×6 IMPLANT
SPONGE LAP 18X18 X RAY DECT (DISPOSABLE) ×3 IMPLANT
STRIP CLOSURE SKIN 1/2X4 (GAUZE/BANDAGES/DRESSINGS) ×2 IMPLANT
SUT CHROMIC 0 CTX 36 (SUTURE) ×12 IMPLANT
SUT MON AB 4-0 PS1 27 (SUTURE) ×3 IMPLANT
SUT PDS AB 0 CT1 27 (SUTURE) ×6 IMPLANT
SUT PDS AB 0 CTX 60 (SUTURE) ×3 IMPLANT
SUT VIC AB 3-0 CT1 27 (SUTURE) ×4
SUT VIC AB 3-0 CT1 TAPERPNT 27 (SUTURE) ×2 IMPLANT
TOWEL OR 17X24 6PK STRL BLUE (TOWEL DISPOSABLE) ×3 IMPLANT
TRAY FOLEY CATH SILVER 14FR (SET/KITS/TRAYS/PACK) ×3 IMPLANT

## 2016-04-06 NOTE — Anesthesia Postprocedure Evaluation (Signed)
Anesthesia Post Note  Patient: Sonya Harrison  Procedure(s) Performed: Procedure(s) (LRB): CESAREAN SECTION WITH BILATERAL TUBAL LIGATION, CERCLAGE REMOVAL (Bilateral)  Patient location during evaluation: Mother Baby Anesthesia Type: Spinal Level of consciousness: awake Pain management: satisfactory to patient Vital Signs Assessment: post-procedure vital signs reviewed and stable Respiratory status: spontaneous breathing Cardiovascular status: stable Anesthetic complications: no     Last Vitals:  Vitals:   04/06/16 1110 04/06/16 1210  BP: 112/70 (!) 122/57  Pulse: 77 83  Resp: 18 18  Temp: 36.6 C 36.4 C    Last Pain:  Vitals:   04/06/16 1210  TempSrc: Oral  PainSc: 3    Pain Goal: Patients Stated Pain Goal: 3 (04/06/16 1210)               Cephus ShellingBURGER,Mattye Verdone

## 2016-04-06 NOTE — Anesthesia Procedure Notes (Signed)
Spinal  Patient location during procedure: OB Start time: 04/06/2016 7:35 AM End time: 04/06/2016 7:37 AM Staffing Anesthesiologist: Linton RumpALLAN, JENNIFER DICKERSON Performed: anesthesiologist  Preanesthetic Checklist Completed: patient identified, surgical consent, pre-op evaluation, timeout performed, IV checked, risks and benefits discussed and monitors and equipment checked Spinal Block Patient position: sitting Prep: DuraPrep Patient monitoring: heart rate, cardiac monitor, continuous pulse ox and blood pressure Approach: midline Location: L3-4 Injection technique: single-shot Needle Needle type: Pencan  Needle gauge: 24 G Needle length: 9 cm Assessment Sensory level: T4

## 2016-04-06 NOTE — Transfer of Care (Signed)
Immediate Anesthesia Transfer of Care Note  Patient: Sonya Harrison  Procedure(s) Performed: Procedure(s) with comments: CESAREAN SECTION WITH BILATERAL TUBAL LIGATION, CERCLAGE REMOVAL (Bilateral) - [facility] 04/13/16 Curly Rimracey T, RNFA  Patient Location: PACU  Anesthesia Type:Spinal  Level of Consciousness: awake  Airway & Oxygen Therapy: Patient Spontanous Breathing  Post-op Assessment: Report given to RN  Post vital signs: Reviewed  Last Vitals:  Vitals:   04/06/16 0640  BP: (!) 121/93  Pulse: (!) 120  Resp: 16  Temp: 37 C    Last Pain: There were no vitals filed for this visit.    Patients Stated Pain Goal: 3 (04/06/16 0640)  Complications: No apparent anesthesia complications

## 2016-04-06 NOTE — Lactation Note (Signed)
This note was copied from a baby's chart. Lactation Consultation Note  Patient Name: Sonya Aldine ContesJessy Harrison WUJWJ'XToday's Date: 04/06/2016 Reason for consult: Initial assessment;Other (Comment) (permom baby fed at 1pm for 15 mins , LC encouraged mom to call on the nurses light for feeding assessment )  Mom is an experienced breast feeder of 3 - breast fed 6 months each . Baby has been to the breast 20 mins at 0900, and 15 mins at 1345 per parents.  Per mom I don't think I have milk. Lc explained supply and demand, also being an experienced breast feeder . LC also explained baby is off to a good start and they have time.  Described hand expressing and the importance of prior to latching - breast massage, hand express, and latch. Also since the baby recently fed to call with feeding cues so feeding  Assessment can be done. Presently the NT is doing a hearing screen on baby and plans to give the baby a bath after hearing screen.  Mother informed of post-discharge support and given phone number to the lactation department, including services for phone call assistance; out-patient appointments; and breastfeeding  support group. List of other breastfeeding resources in the community given in the handout. Encouraged mother to call for problems or concerns related to breastfeeding.   Maternal Data Has patient been taught Hand Expression?:  (explained to mom the importanceand described technique to her ) Does the patient have breastfeeding experience prior to this delivery?: Yes  Feeding Feeding Type:  (last fed at 1300 for 15 mins ) Length of feed: 15 min  LATCH Score/Interventions                      Lactation Tools Discussed/Used WIC Program: No   Consult Status Consult Status: Follow-up Date: 04/06/16 Follow-up type: In-patient    Sonya Harrison, Sonya Harrison Ann 04/06/2016, 2:24 PM

## 2016-04-06 NOTE — Progress Notes (Signed)
The patient was re-examined with no change in status 

## 2016-04-06 NOTE — Op Note (Signed)
Preoperative diagnosis: Term pregnancy, previous cesarean section, incompetent cervix with cerclage, request permanent sterilization  Postoperative diagnosis: Same  Procedure: Repeat low transverse cesarean section, removal of cerclage, Filshie clip tubal ligation  Surgeon: Marcelle OverlieHolland  Anesthesia: Spinal  EBL: 800  Procedure and findings:  Patient taken the operating room after an adequate level spinal anesthetic was obtained with the patient in left tilt position the abdomen perineum and vagina were prepped for repeat section Foley catheter position. Appropriate timeouts taken at that point.  Transverse incision made excising the old scar this is carried down to the fascia which was incised and extended transversely. Rectus muscles divided in the midline, peritoneum entered superiorly without incident and extended in a vertical fashion. The vesicouterine serosa was incised and the bladder was bluntly and sharply dissected below, bladder blade repositioned, transverse incision made lower segment extended with blunt dissection clear fluid noted the patient then delivered of a healthy female Apgars 9 and 9, the infant was suctioned, cord clamped and passed the pediatric team for further care. The placenta was then delivered manually intact. Uterus exteriorized, cavity wiped clean with laparotomy pack, closure obtained the first layer of 0 chromic in a locked fashion followed by number cutting layer of 0 chromic. This is hemostatic bilateral tubes and ovaries were normal the bladder flap area was intact and hemostatic. Per her request, Filshie clip application was carried out oh playing at a right angle 3 cm from the cornu on each side with excellent application. Once this was completed the uterus is then returned its intra-abdominal position. Prior to closure sponge, needle, history counts reported as correct 2. The peritoneum was not closed separately due to tension, a double looped 0 PDS was then used to  close the fascia. Subcutaneous tissue  a was undermined to effect better closure reduce tension this was rendered hemostatic with the Bovie. 4-0 Monocryl subcuticular skin closure with Steri-Strips and a pressure dressing. She tolerated this well went to recovery room in good condition.  Dictated with [facility]  Rachael Ferrie Milana ObeyM Shirrell Solinger M.D.

## 2016-04-07 ENCOUNTER — Encounter (HOSPITAL_COMMUNITY): Payer: Self-pay | Admitting: Obstetrics and Gynecology

## 2016-04-07 LAB — BIRTH TISSUE RECOVERY COLLECTION (PLACENTA DONATION)

## 2016-04-07 LAB — CBC
HCT: 26.4 % — ABNORMAL LOW (ref 36.0–46.0)
Hemoglobin: 9.1 g/dL — ABNORMAL LOW (ref 12.0–15.0)
MCH: 25.9 pg — AB (ref 26.0–34.0)
MCHC: 34.5 g/dL (ref 30.0–36.0)
MCV: 75.2 fL — AB (ref 78.0–100.0)
PLATELETS: 269 10*3/uL (ref 150–400)
RBC: 3.51 MIL/uL — ABNORMAL LOW (ref 3.87–5.11)
RDW: 19.7 % — ABNORMAL HIGH (ref 11.5–15.5)
WBC: 12.7 10*3/uL — ABNORMAL HIGH (ref 4.0–10.5)

## 2016-04-07 NOTE — Progress Notes (Signed)
Changed dressing using sterile technique. Pt tolerated well.

## 2016-04-07 NOTE — Lactation Note (Signed)
This note was copied from a baby's chart. Lactation Consultation Note Follow up visit at 34 hours of age.  Baby has breast fed 6 times and bottle fed 3 in the past 24 hours with 3 void, and 3 stools.  Mom is requesting to pump and plans to request DEBP from insurance to use for when she returns to work.  Mom is wanting to use formula and bottle feeding and has education regarding both.  LC assisted with hand expression and only a glisten noted on left breast.  Mom reports a good supply of milk with 3 older children, but also bottle and formula fed with breast feeding.  Baby latched in football hold on left breast with support to "sandwich" moms breasts for a deeper latch.  LC instructed FOB on how to assist mom as needed.  Baby has wide gape with rhythmic sucking during stimulation.  No audible swallows, but good jaw movement noted.  LC set up DEBP with cleaning and storage guidelines discussed. LC encouraged mom to post pump or when she offers a bottle to help increase her supply.  Discussed that mom may not see expressed milk at this time, but to continue for stimulation.  Mom encouraged to work on hand expression after and apply to nipples.  LC set up with #24 a good size flange fit at this time and mom aware to use #27 flanges as needed and call for assist if needed.  Mom denies other concerns.  FOB and family at bedside supportive.    Patient Name: Sonya Aldine ContesJessy Harrison ZOXWR'UToday's Date: 04/07/2016 Reason for consult: Follow-up assessment   Maternal Data    Feeding Feeding Type: Breast Fed Length of feed: 15 min  LATCH Score/Interventions Latch: Repeated attempts needed to sustain latch, nipple held in mouth throughout feeding, stimulation needed to elicit sucking reflex. Intervention(s): Adjust position;Assist with latch;Breast massage;Breast compression  Audible Swallowing: A few with stimulation Intervention(s): Skin to skin;Hand expression;Alternate breast massage  Type of Nipple: Everted at  rest and after stimulation  Comfort (Breast/Nipple): Soft / non-tender     Hold (Positioning): Assistance needed to correctly position infant at breast and maintain latch. Intervention(s): Breastfeeding basics reviewed;Support Pillows;Position options;Skin to skin  LATCH Score: 7  Lactation Tools Discussed/Used Pump Review: Setup, frequency, and cleaning;Milk Storage Initiated by:: JS Date initiated:: 04/07/16   Consult Status Consult Status: Follow-up Date: 04/08/16 Follow-up type: In-patient    Sonya Harrison, Sonya MerlesJana Harrison 04/07/2016, 6:55 PM

## 2016-04-07 NOTE — Progress Notes (Signed)
Subjective: Postpartum Day 1: Cesarean Delivery Patient reports tolerating PO and no problems voiding.    Objective: Vital signs in last 24 hours: Temp:  [97.6 F (36.4 C)-98.8 F (37.1 C)] 98.6 F (37 C) (08/25 0630) Pulse Rate:  [77-117] 117 (08/25 0630) Resp:  [16-20] 18 (08/25 0630) BP: (107-128)/(55-72) 108/55 (08/25 0630) SpO2:  [95 %-100 %] 95 % (08/25 0630)  Physical Exam:  General: alert and cooperative Lochia: appropriate Uterine Fundus: firm Incision: abd dressing CDI DVT Evaluation: No evidence of DVT seen on physical exam. Negative Homan's sign. No cords or calf tenderness. No significant calf/ankle edema.   Recent Labs  04/05/16 1100 04/07/16 0514  HGB 11.3* 9.1*  HCT 32.6* 26.4*    Assessment/Plan: Status post Cesarean section. Doing well postoperatively.  Continue current care.  Allyn Bartelson G 04/07/2016, 8:42 AM

## 2016-04-08 MED ORDER — HYDROCODONE-ACETAMINOPHEN 5-325 MG PO TABS: 1.0000 | ORAL_TABLET | ORAL | Status: DC | PRN

## 2016-04-08 MED ORDER — IBUPROFEN 800 MG PO TABS: 800.0000 mg | ORAL_TABLET | Freq: Three times a day (TID) | ORAL | 0 refills | Status: DC | PRN

## 2016-04-08 MED ORDER — HYDROCODONE-ACETAMINOPHEN 5-325 MG PO TABS: 1.0000 | ORAL_TABLET | ORAL | 0 refills | Status: DC | PRN

## 2016-04-08 NOTE — Lactation Note (Signed)
This note was copied from a baby's chart. Lactation Consultation Note 2 week [employer] rental completed- mom wants to have DEBP at home when milk supply increases. States she makes a lot of milk. Reviewed engorgement prevention and treatment. No questions at present. To call prn  Patient Name: Sonya Harrison ZOXWR'UToday's Date: 04/08/2016 Reason for consult: Follow-up assessment   Maternal Data Formula Feeding for Exclusion: Yes Reason for exclusion: Mother's choice to formula and breast feed on admission Has patient been taught Hand Expression?: Yes Does the patient have breastfeeding experience prior to this delivery?: Yes  Feeding    LATCH Score/Interventions                      Lactation Tools Discussed/Used Pump Review: Setup, frequency, and cleaning Initiated by:: RN Date initiated:: 04/08/16   Consult Status Consult Status: Complete Date: 04/08/16 Follow-up type: In-patient    Pamelia HoitWeeks, [name] 04/08/2016, 8:53 AM

## 2016-04-08 NOTE — Lactation Note (Signed)
This note was copied from a baby's chart. Lactation Consultation Note: Experienced BF mom reports baby has been feeding a lot through the night. Reports baby is latching well. Dad fed 59 mls of formula at 7 am because he was still hungry.  Encouraged frequent nursing to promote milk supply DEBP set up last night but mom did not use it because he was feeding so much. Asking about pump rental  Encouraged to call insurance company about pump. Mom wants 2 week rental to have one at home when her milk comes in. No questions at present. To call prn  Patient Name: Boy Aldine ContesJessy Schuhmacher ONGEX'BToday's Date: 04/08/2016 Reason for consult: Follow-up assessment   Maternal Data Formula Feeding for Exclusion: Yes Reason for exclusion: Mother's choice to formula and breast feed on admission Has patient been taught Hand Expression?: Yes Does the patient have breastfeeding experience prior to this delivery?: Yes  Feeding    LATCH Score/Interventions                      Lactation Tools Discussed/Used     Consult Status Consult Status: Follow-up Date: 04/08/16 Follow-up type: In-patient    Pamelia HoitWeeks, Yazleemar Strassner D 04/08/2016, 8:30 AM

## 2016-04-08 NOTE — Discharge Summary (Signed)
Obstetric Discharge Summary Reason for Admission: cesarean section Prenatal Procedures: ultrasound Intrapartum Procedures: cesarean: low cervical, transverse and tubal ligation Postpartum Procedures: none Complications-Operative and Postpartum: none Hemoglobin  Date Value Ref Range Status  04/07/2016 9.1 (L) 12.0 - 15.0 g/dL Final   HCT  Date Value Ref Range Status  04/07/2016 26.4 (L) 36.0 - 46.0 % Final    Physical Exam:  General: alert and cooperative Lochia: appropriate Uterine Fundus: firm Incision: no significant drainage DVT Evaluation: No evidence of DVT seen on physical exam.  Discharge Diagnoses: Term Pregnancy-delivered  Discharge Information: Date: 04/08/2016 Activity: pelvic rest Diet: routine Medications: PNV, Ibuprofen and Vicodin Condition: stable Instructions: refer to practice specific booklet Discharge to: home   Newborn Data: Live born female  Birth Weight: 8 lb 13.1 oz (4000 g) APGAR: 8, 9  Home with mother.  Annabeth Tortora 04/08/2016, 7:47 AM

## 2016-04-09 LAB — TYPE AND SCREEN
ABO/RH(D): O POS
Antibody Screen: NEGATIVE
UNIT DIVISION: 0
Unit division: 0

## 2016-04-09 NOTE — Discharge Planning (Signed)
Final check of circumcision completed on baby prior to discharge.

## 2016-04-21 NOTE — Addendum Note (Signed)
Encounter addended by: Rema FendtJoshua Beuna Bolding, MD on: 04/21/2016  9:41 AM<BR>    Actions taken: Order Reconciliation Section accessed, Visit diagnoses modified, Sign clinical note

## 2016-04-21 NOTE — Progress Notes (Signed)
Ms. Sonya Harrison presented for her second dose of betamethasone due to cervical shortening.  Rema FendtNITSCHE,Gricel Copen

## 2016-04-25 ENCOUNTER — Other Ambulatory Visit (HOSPITAL_COMMUNITY): Payer: Self-pay

## 2016-08-15 IMAGING — US US MFM OB TRANSVAGINAL
1 series · 15 of 28 positions shown · non-contrast
Comparison: none

[Series 1: us mfm ob transvaginal · 32 acquisitions, 15 frames shown]
[im 1/32]
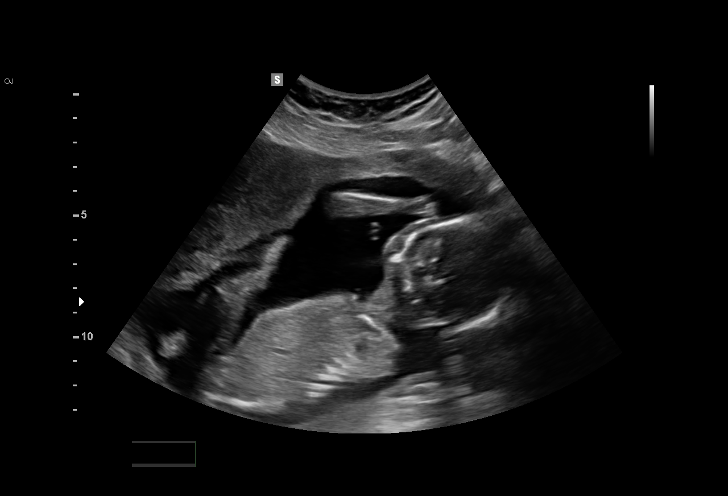
[im 3/32]
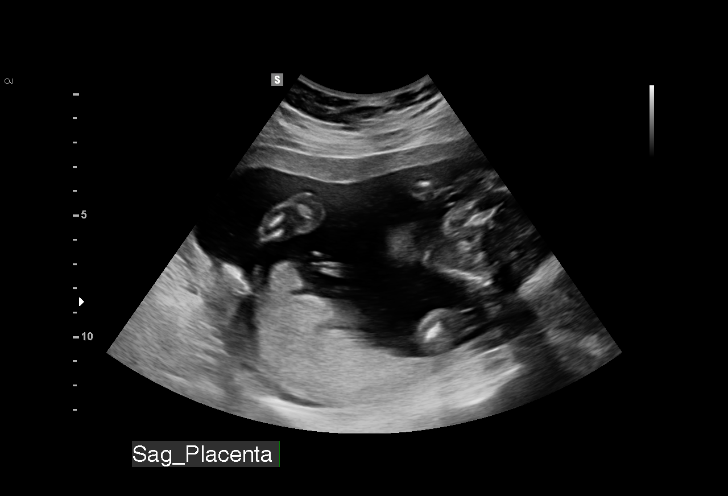
[im 5/32]
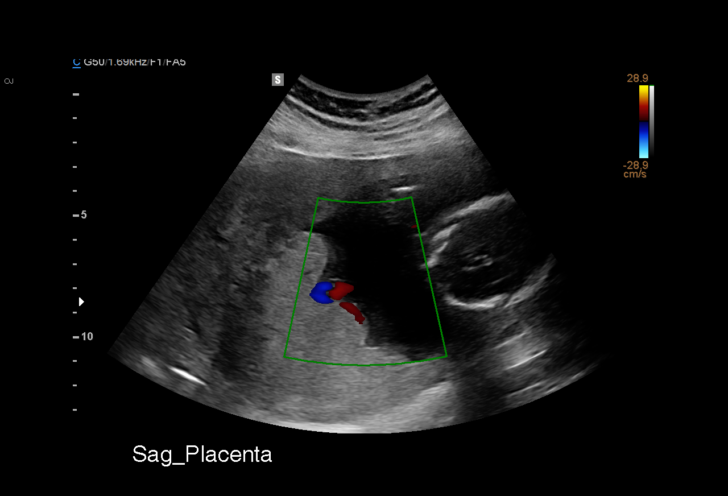
[im 7/32]
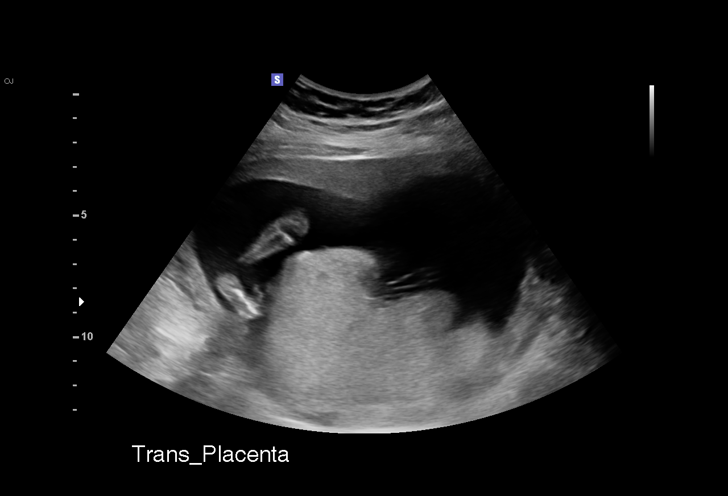
[im 10/32]
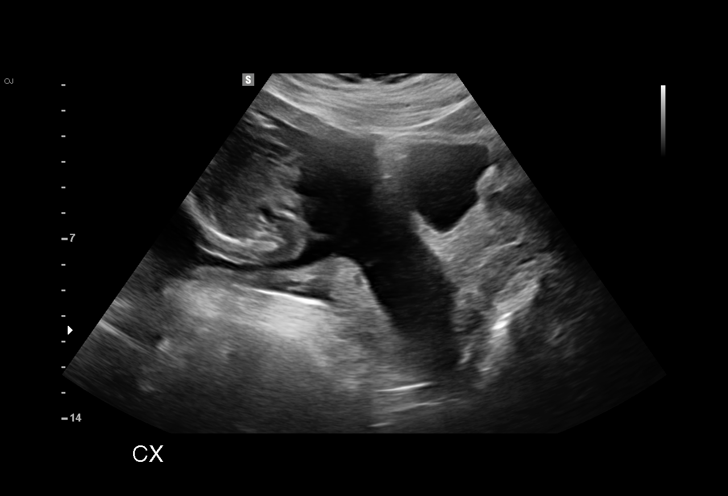
[im 12/32]
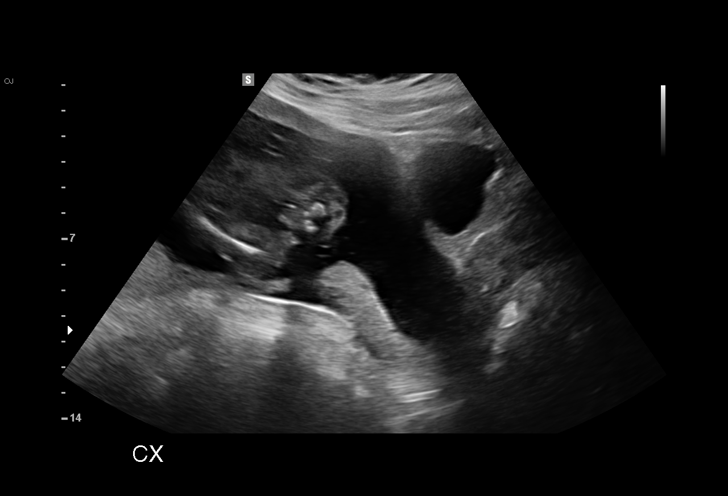
[im 14/32]
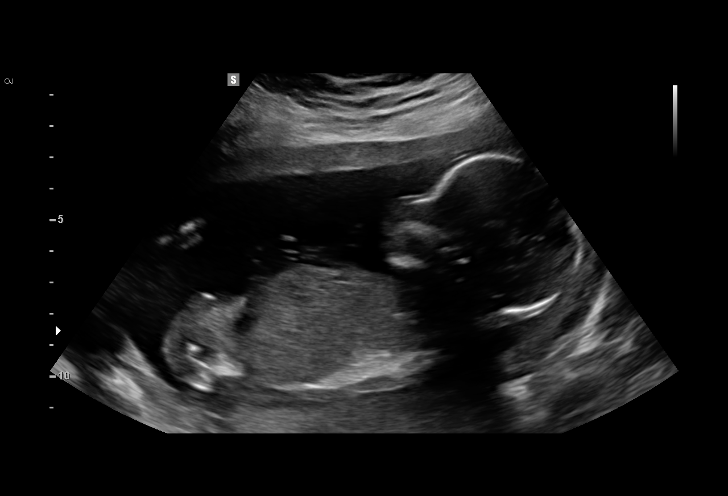
[im 17/32]
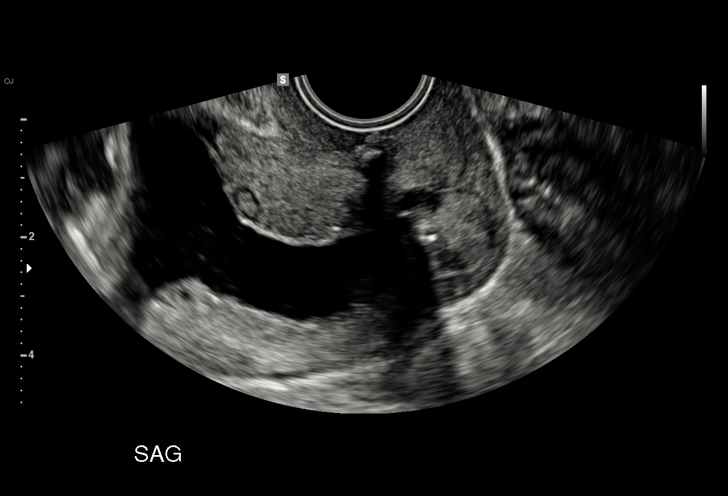
[im 18/32]
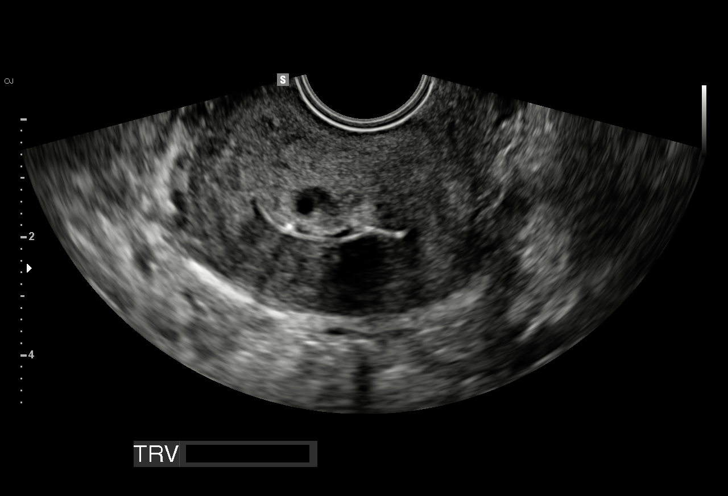
[im 20/32]
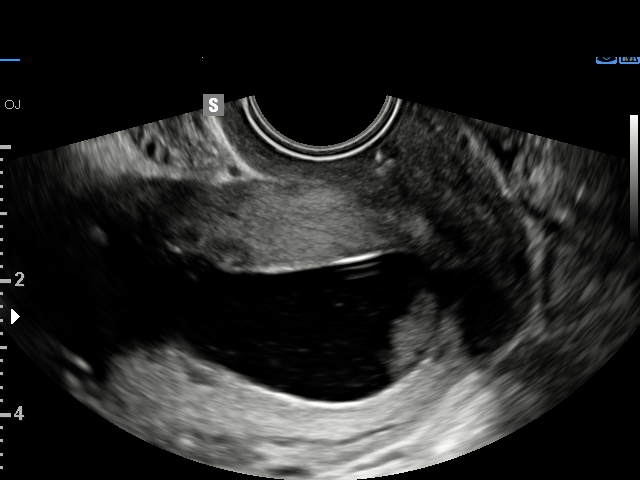
[im 22/32]
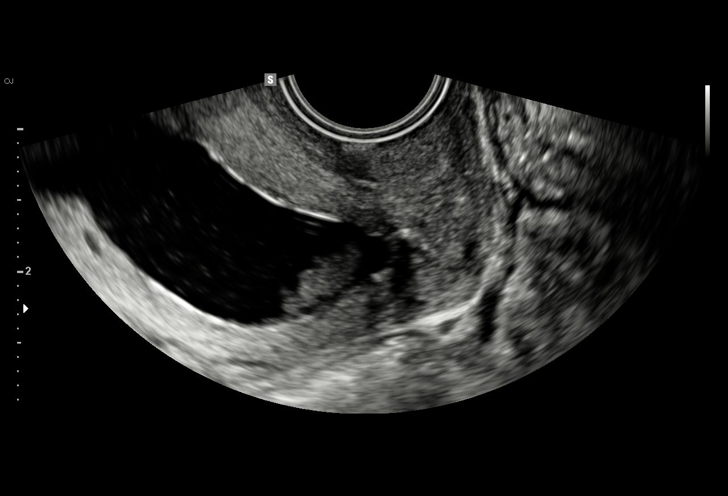
[im 25/32]
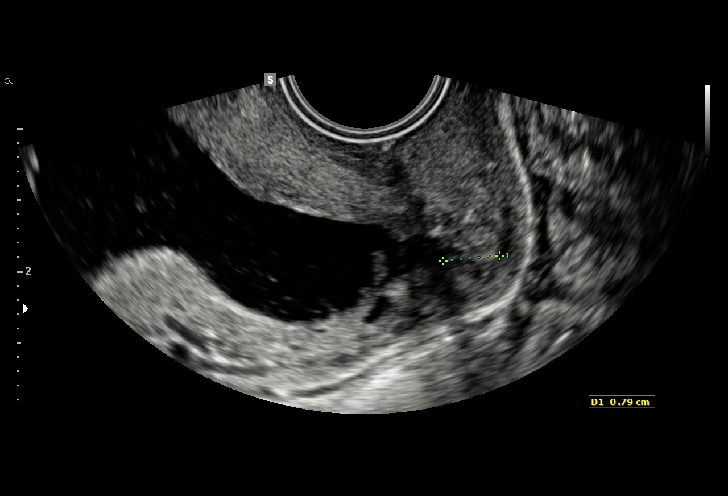
[im 27/32]
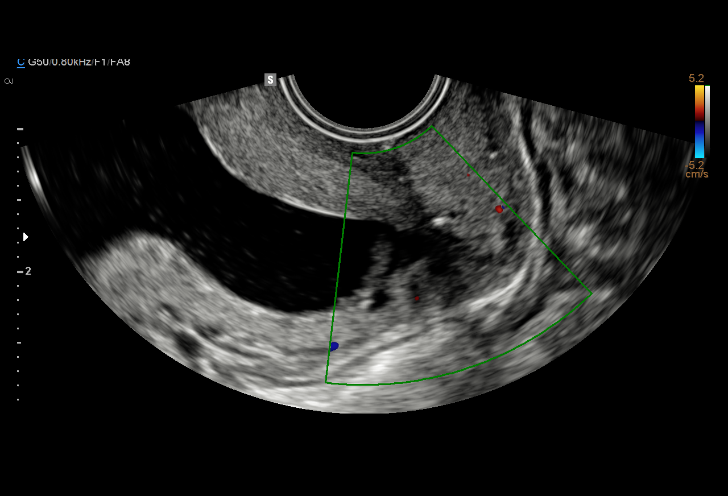
[im 29/32]
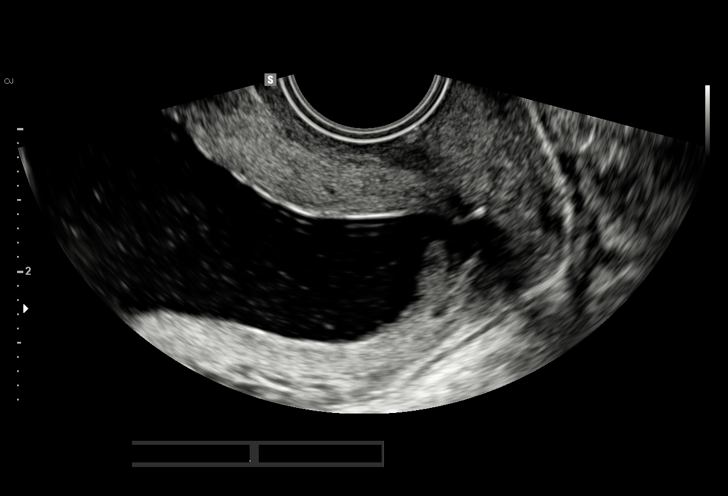
[im 32/32]
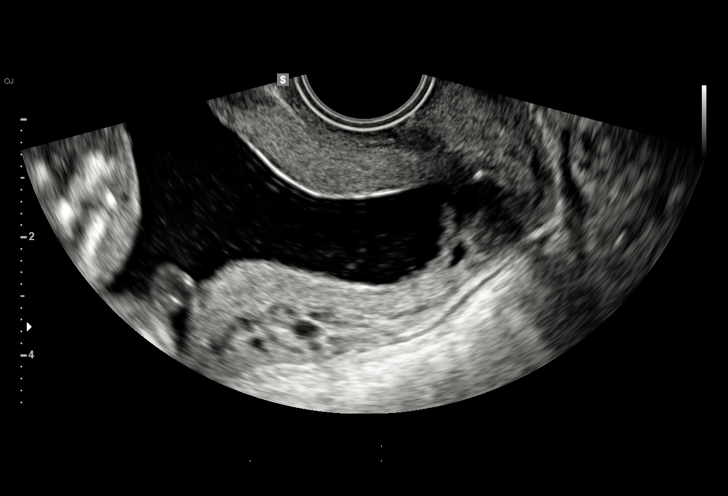

[15 of 28 positions shown; findings below may reference images not displayed]

[id number]
[facility] [[facility]]

1  RTOYOTA JOSHJAX            [id number]      [id number]     [id number]
Indications

18 weeks gestation of pregnancy
Cervical cerclage suture present, second
trimester
Cervical incompetence, second trimester
(progesterone)
Advanced maternal age multigravida 34,
second trimester (low risk NIPS)
Previous cesarean delivery x 3, antepartum
Poor obstetric history: Previous preterm
delivery (20 wk PROM, 30 wk abruption, 19
wk funneled through cerclage)
OB History

Gravidity:    7         Term:   2        Prem:   1         SAB:   2
TOP:          1       Ectopic:  0        Living: 3
Fetal Evaluation

Num Of Fetuses:     1
Fetal Heart         159
Rate(bpm):
Cardiac Activity:   Observed
Presentation:       Cephalic
Placenta:           Posterior, above cervical os

Amniotic Fluid
AFI FV:      Subjectively within normal limits
Larg Pckt:    4.2  cm
Gestational Age

LMP:           18w 6d        Date:  07/08/15                 EDD:    04/13/16
Best:          18w 6d     Det. By:  LMP  (07/08/15)          EDD:    04/13/16
Cervix Uterus Adnexa

Cervix
Length:            0.9  cm.
Appears funnelled, see comments.
Impression

SIUP at 18+6 weeks
Normal amniotic fluid volume
EV views of cervix: funneling with distal closed portion
measuring 
 9 mms; 2 sutures visualized
Recommendations

Continue vaginal progesterone
CL in one week

## 2016-10-10 IMAGING — US US MFM OB TRANSVAGINAL
1 series · 13 of 28 positions shown · non-contrast
Comparison: none

[Series 1: us mfm ob transvaginal · 13 of 92 slices shown]
[im 4/92]
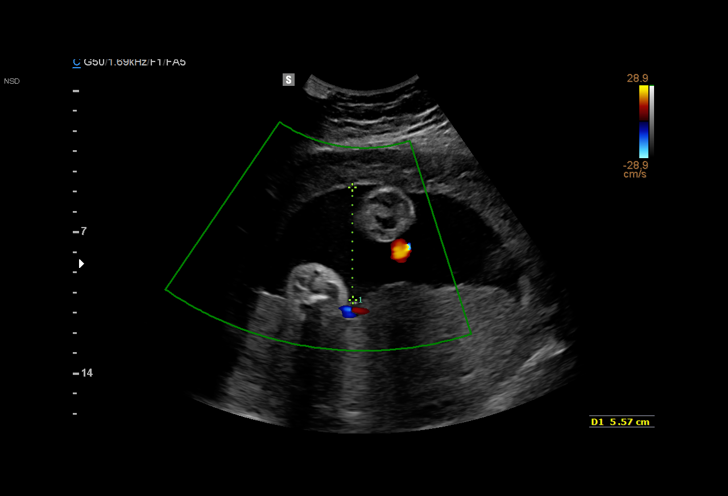
[im 11/92]
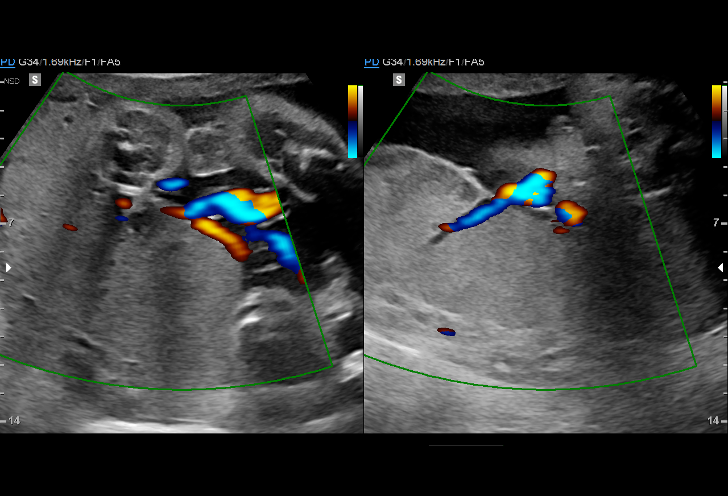
[im 17/92]
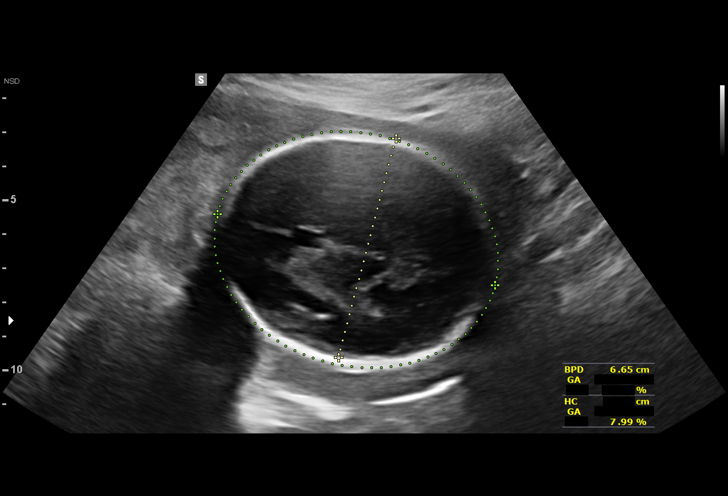
[im 24/92]
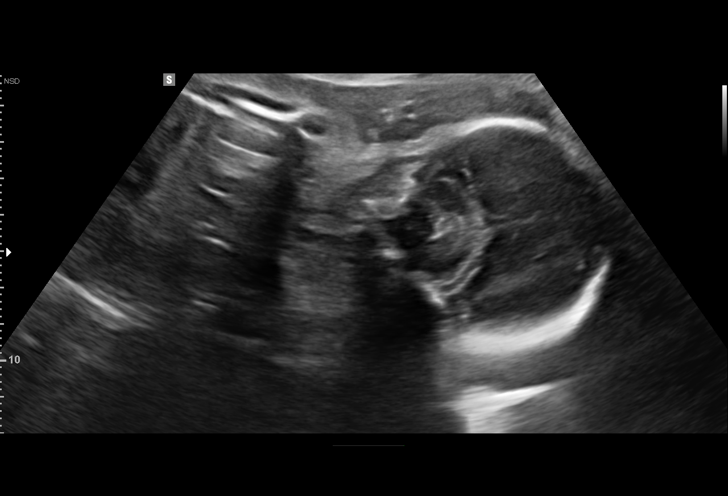
[im 31/92]
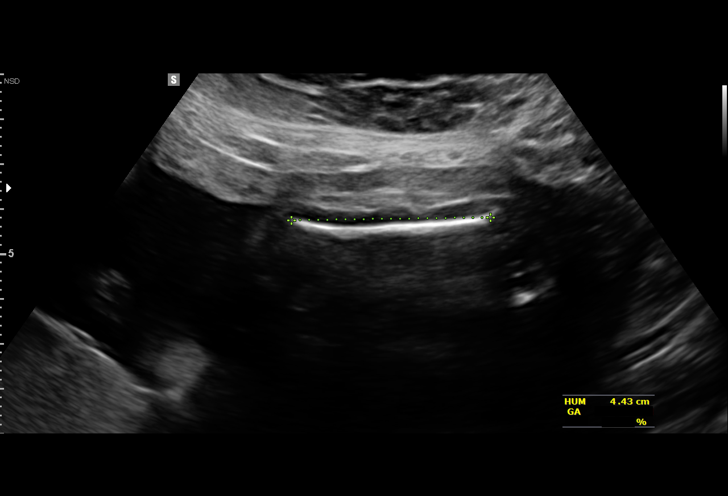
[im 38/92]
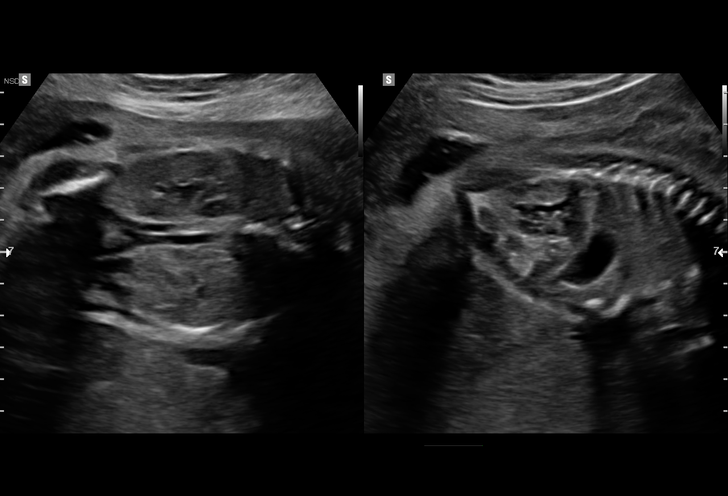
[im 48/92]
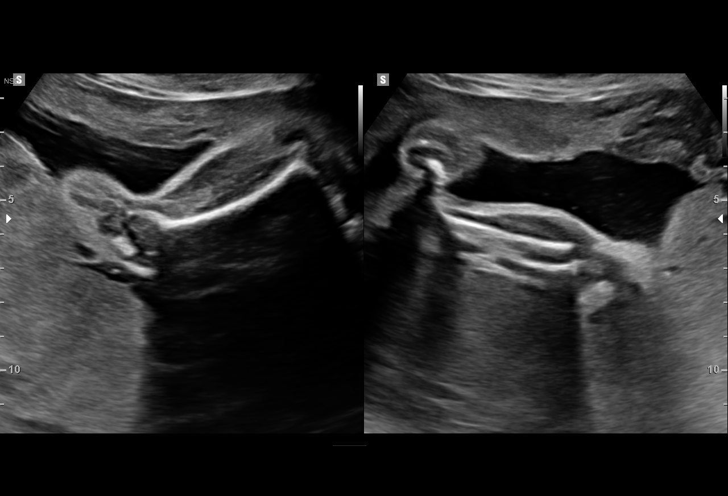
[im 54/92]
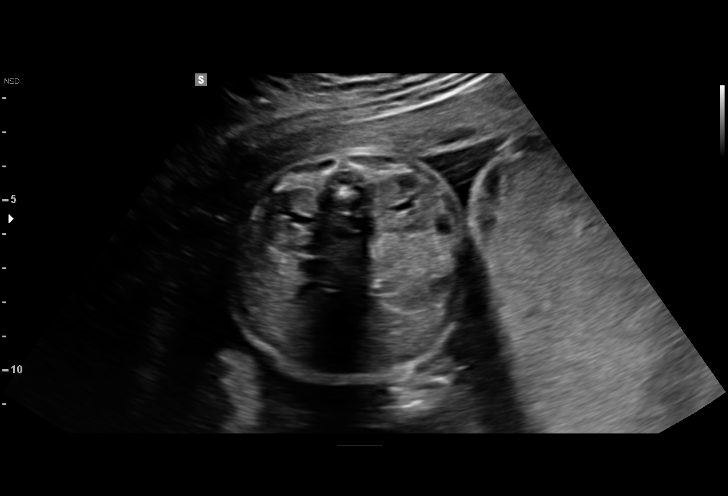
[im 61/92]
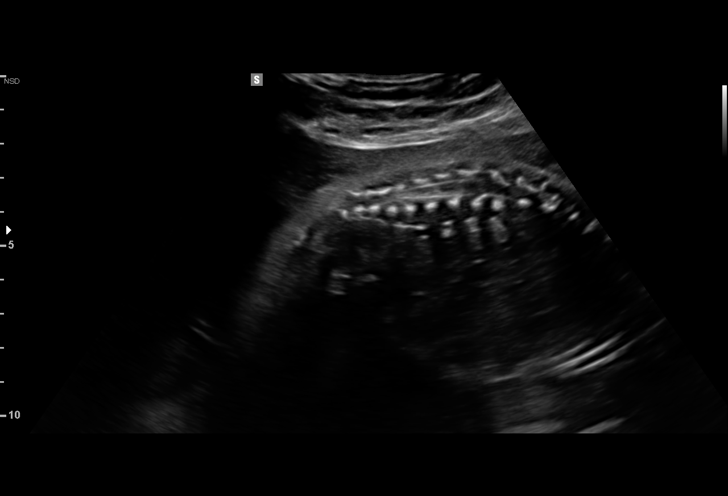
[im 68/92]
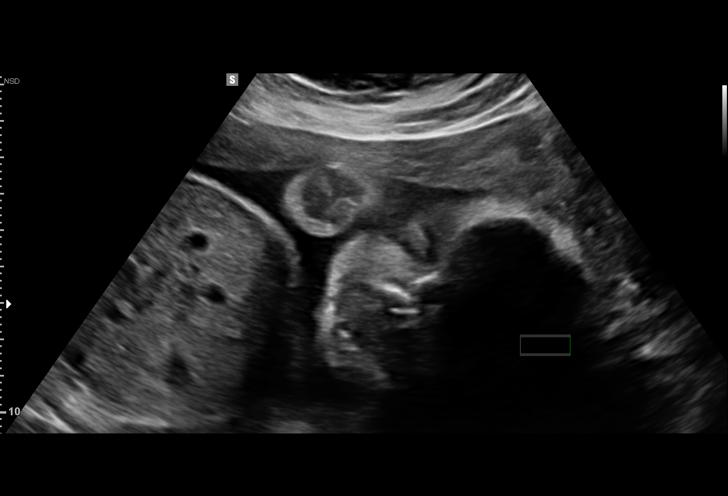
[im 75/92]
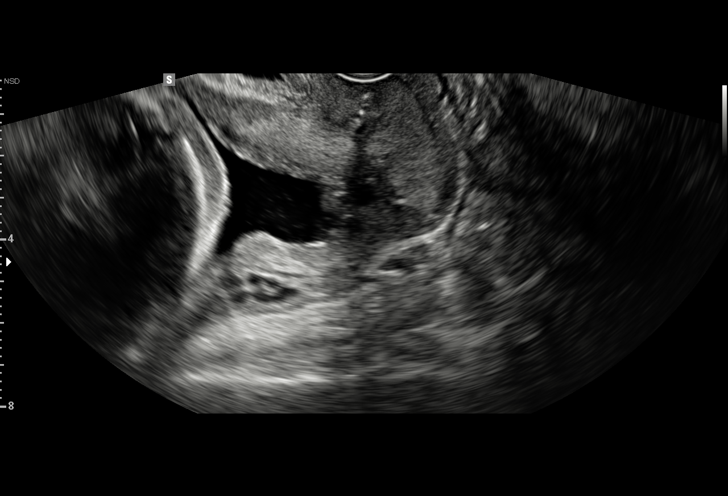
[im 81/92]
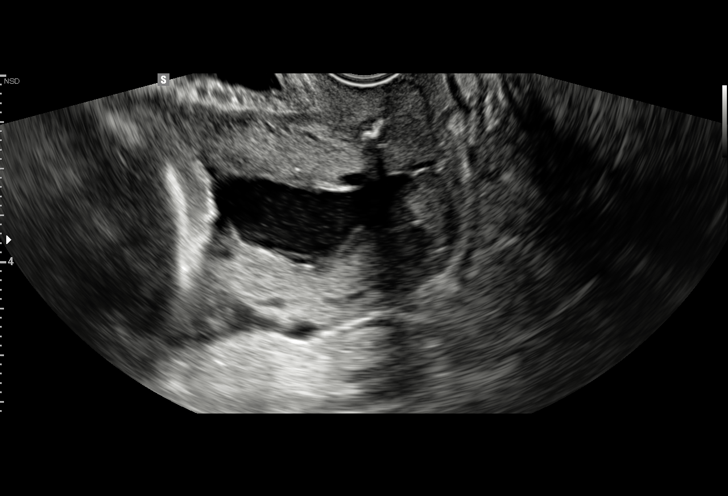
[im 88/92]
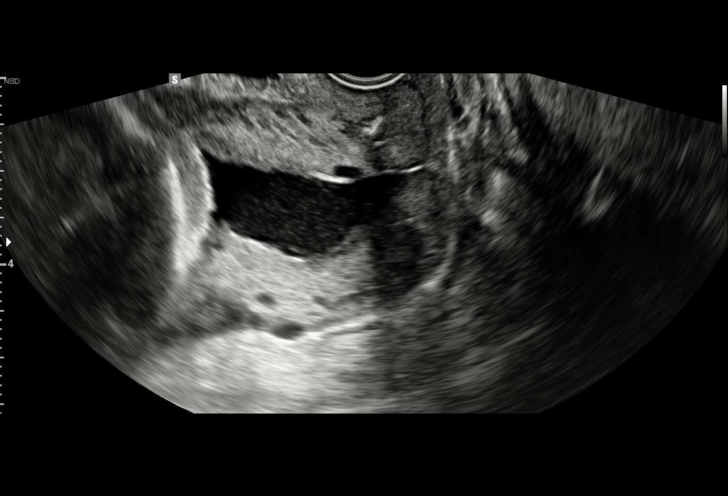

[13 of 28 positions shown; findings below may reference images not displayed]

[id number]
[facility] [[facility]]

1  BLANDINA AMPONSAH            [id number]      [id number]     [id number]
2  BLANDINA AMPONSAH            [id number]      [id number]     [id number]
Indications

26 weeks gestation of pregnancy
Basic anatomic survey                          Z36
Advanced maternal age multigravida 34,
second trimester (low risk NIPS)
Previous cesarean delivery x 3, antepartum
Poor obstetric history: Previous preterm
delivery (20 wk PROM, 30 wk abruption, 19
wk funneled through cerclage)
Cervical insufficiency, 2nd
Cervical cerclage suture present, second
trimester
OB History

Gravidity:    7         Term:   2        Prem:   1         SAB:   2
TOP:          1       Ectopic:  0        Living: 3
Fetal Evaluation

Num Of Fetuses:     1
Fetal Heart         182
Rate(bpm):
Cardiac Activity:   Observed
Presentation:       Cephalic
Placenta:           Posterior, above cervical os
P. Cord Insertion:  Visualized, central
Amniotic Fluid
AFI FV:      Subjectively within normal limits

Largest Pocket(cm)
5.6
Biometry

BPD:      66.3  mm     G. Age:  26w 5d         34  %    CI:         75.81  %    70 - 86
FL/HC:       19.9  %    18.6 -
HC:      241.4  mm     G. Age:  26w 2d          9  %    HC/AC:       1.04       1.05 -
AC:        232  mm     G. Age:  27w 4d         63  %    FL/BPD:      72.5  %    71 - 87
FL:       48.1  mm     G. Age:  26w 1d         17  %    FL/AC:       20.7  %    20 - 24
HUM:      44.2  mm     G. Age:  26w 2d         33  %
CER:      30.3  mm     G. Age:  26w 5d         45  %

CM:        8.7  mm

Est. FW:     995   gm     2 lb 3 oz     54  %
Gestational Age

LMP:           26w 6d        Date:  07/08/15                 EDD:    04/13/16
U/S Today:     26w 5d                                        EDD:    04/14/16
Best:          26w 6d     Det. By:  LMP  (07/08/15)          EDD:    04/13/16
Anatomy

Cranium:               Appears normal         LVOT:                   Not well visualized
Cavum:                 Appears normal         Aortic Arch:            Appears normal
Ventricles:            Appears normal         Ductal Arch:            Previously seen
Choroid Plexus:        Not well visualized    Diaphragm:              Appears normal
Cerebellum:            Appears normal         Stomach:                Appears normal, left
sided
Posterior Fossa:       Appears normal         Abdomen:                Appears normal
Nuchal Fold:           Not applicable (>20    Abdominal Wall:         Appears nml (cord
wks GA)                                        insert, abd wall)
Face:                  Orbits and profile     Cord Vessels:           Appears normal (3
previously seen                                vessel cord)
Lips:                  Appears normal         Kidneys:                Appear normal
Palate:                Not well visualized    Bladder:                Appears normal
Thoracic:              Appears normal         Spine:                  Appears normal
Heart:                 Not well visualized    Upper Extremities:      Visualized
RVOT:                  Not well visualized    Lower Extremities:      Visualized

Other:  Parents do not wish to know sex of fetus. Technically difficult due to
advanced GA and fetal position.
Cervix Uterus Adnexa

Cervix
Length:            0.4  cm.
Funneling of internal os noted. Measured transvaginally.
Impression

SIUP at 26+6 weeks
Normal detailed fetal anatomy; limited views of heart
Markers of aneuploidy: none
Normal amniotic fluid volume
Measurements consistent with 54th %tile
EV views of cervix: V-shaped funneling down to/just below
level of cerclage; distal closed portion measured between 4
and 7 mms
Recommendations

Follow-up ultrasound for cervical length in one week (last one
for CL)

## 2016-10-16 IMAGING — US US MFM OB TRANSVAGINAL
1 series · 15 of 27 positions shown · non-contrast
Comparison: none

[Series 1: us mfm ob transvaginal · 27 acquisitions, 15 frames shown]
[im 1/27]
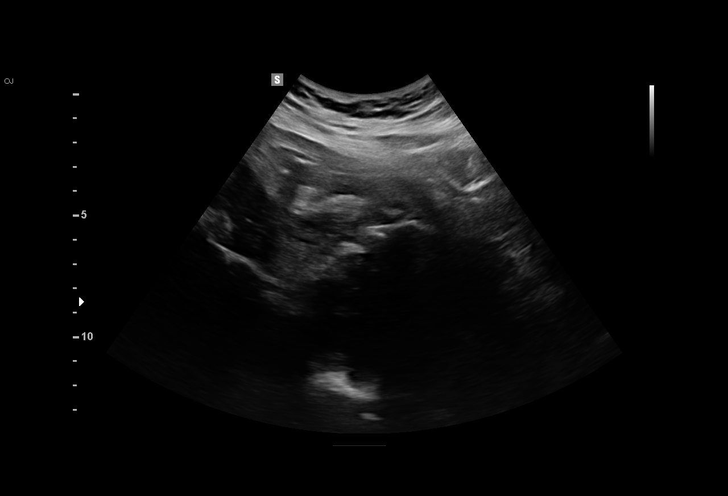
[im 3/27]
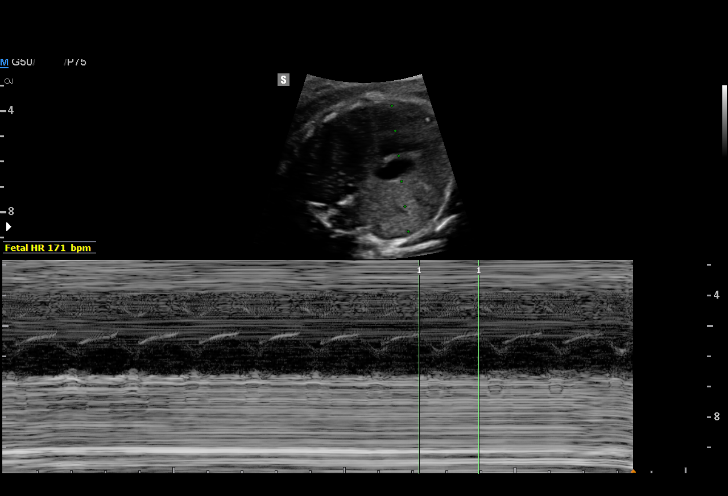
[im 5/27]
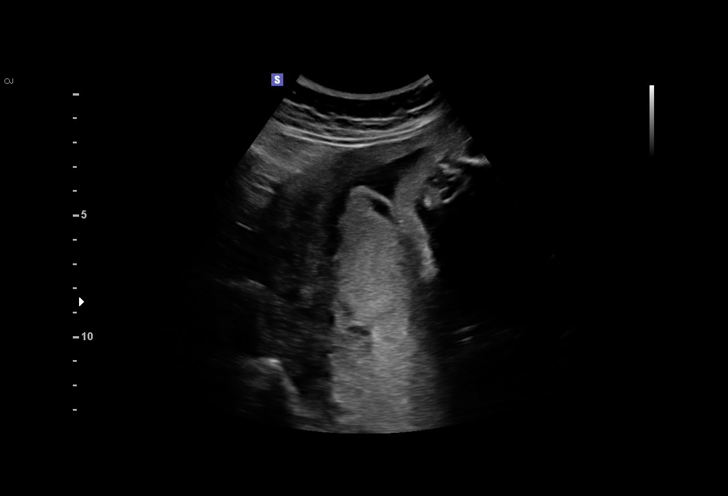
[im 7/27]
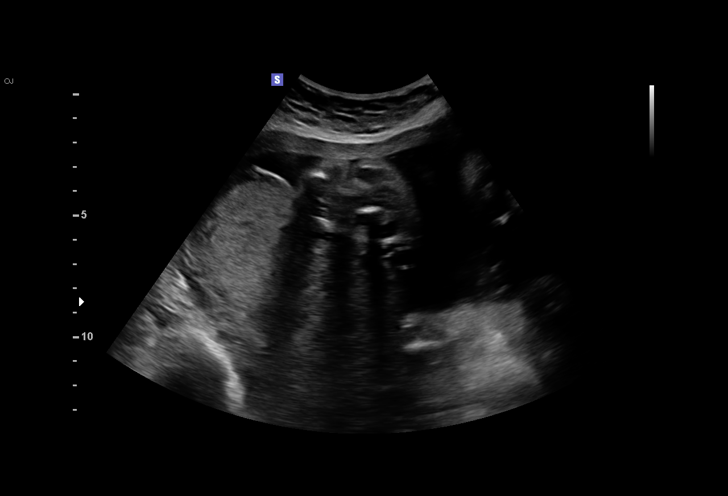
[im 9/27]
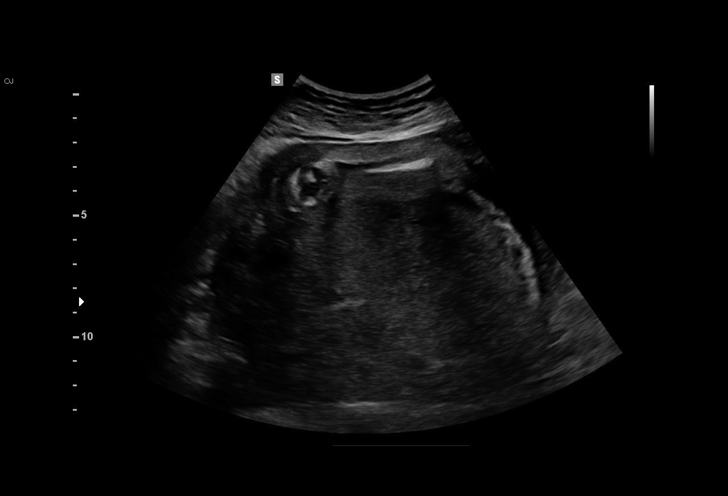
[im 10/27]
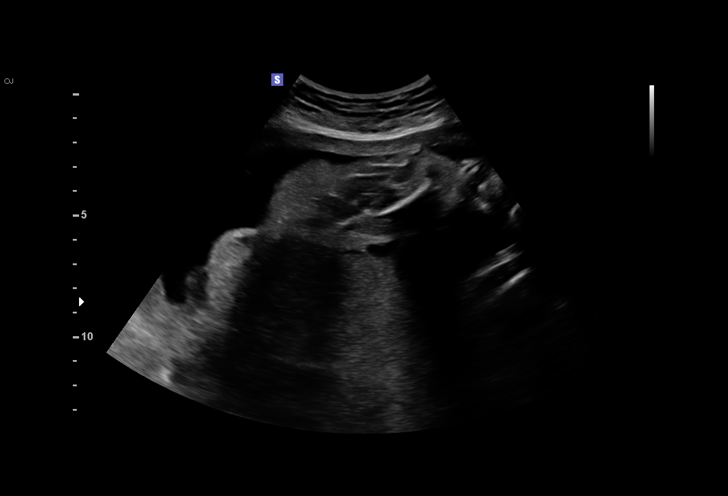
[im 12/27]
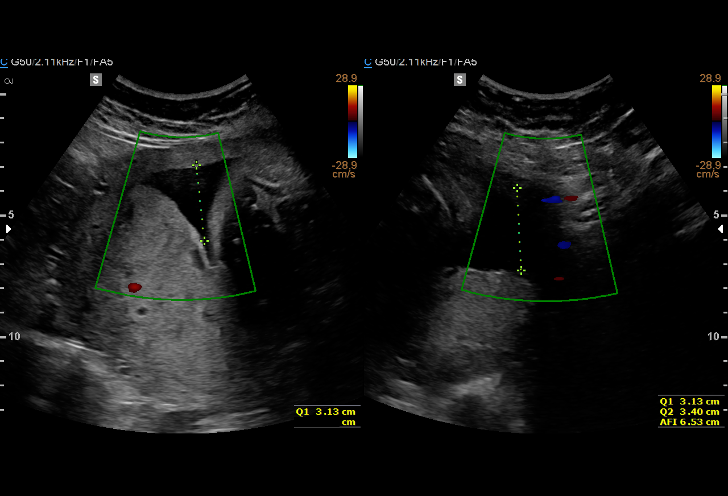
[im 14/27]
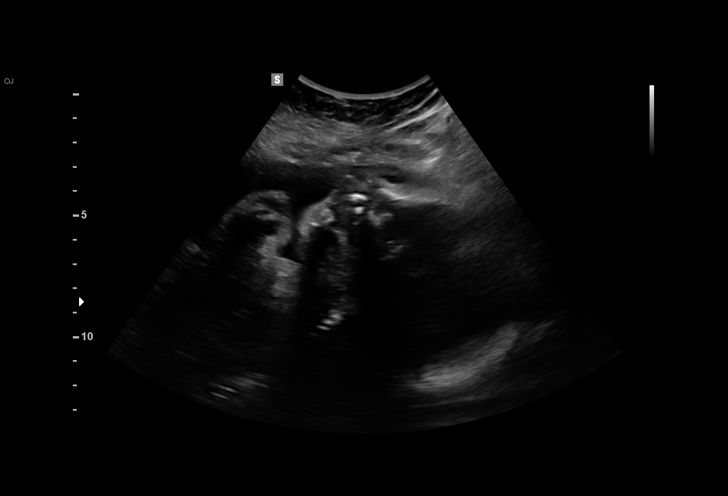
[im 16/27]
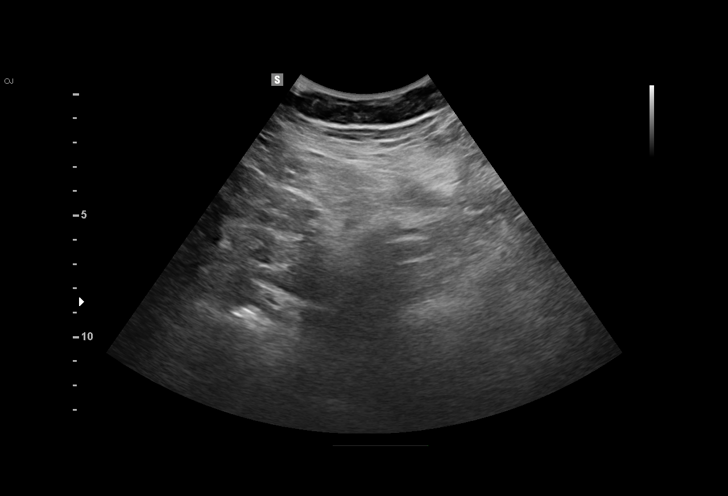
[im 18/27]
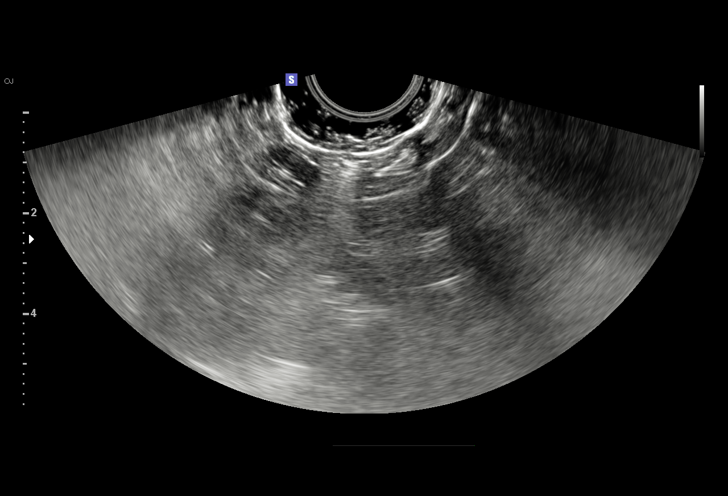
[im 19/27]
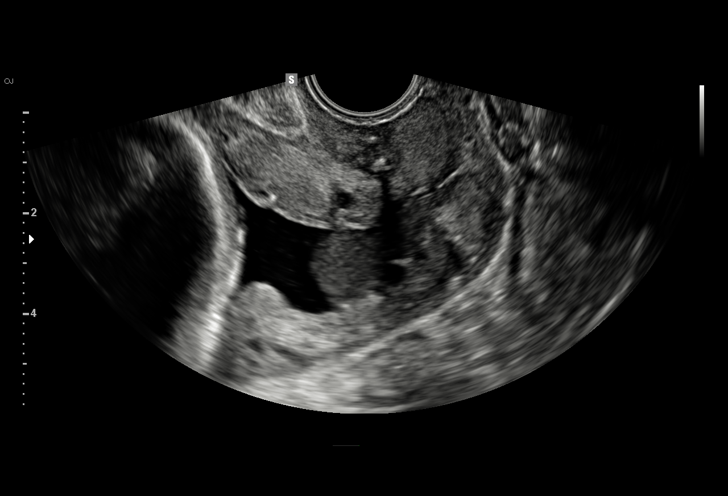
[im 21/27]
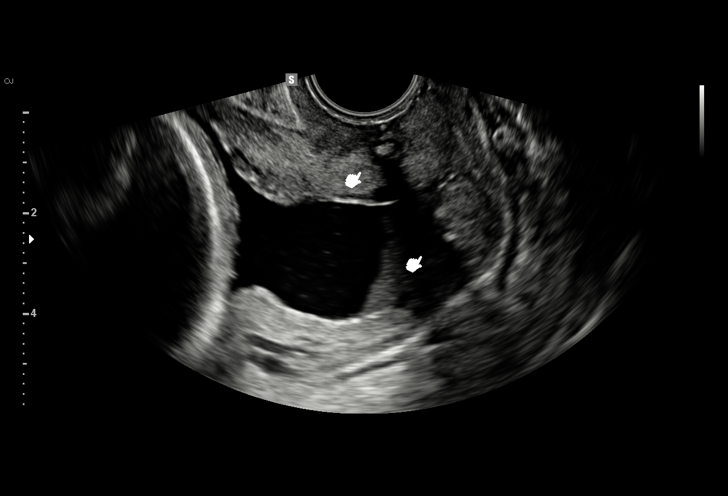
[im 23/27]
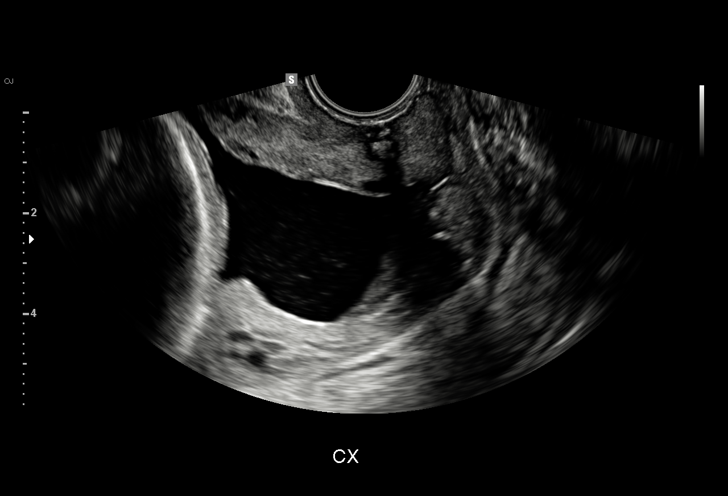
[im 25/27]
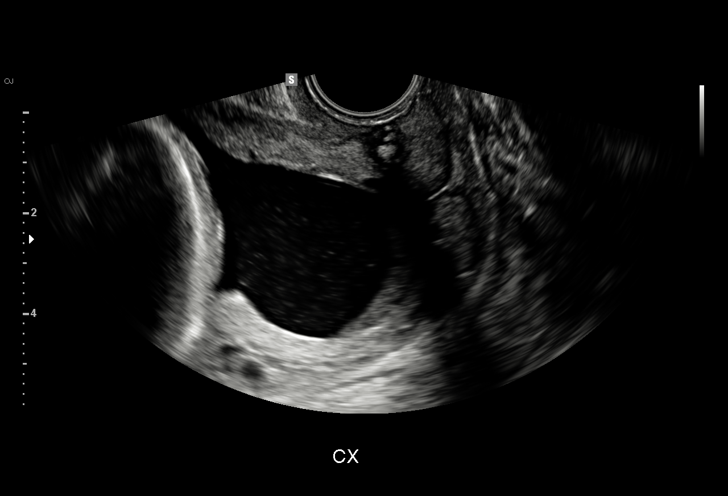
[im 27/27]
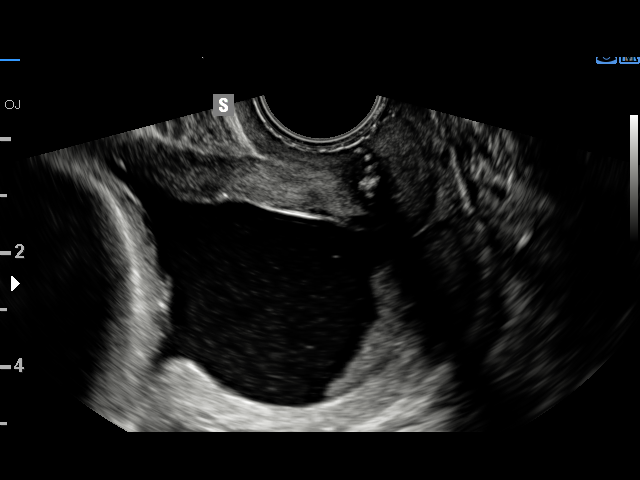

[15 of 27 positions shown; findings below may reference images not displayed]

[id number]
[place] [H[facility]]

1  BRYLEE BLAU            [id number]      [id number]     [id number]
Indications

27 weeks gestation of pregnancy
Advanced maternal age multigravida 34,
second trimester (low risk NIPS)
Previous cesarean delivery x 3, antepartum
Poor obstetric history: Previous preterm
delivery (20 wk PROM, 30 wk abruption, 19
wk funneled through cerclage)
Cervical cerclage suture present, third
trimester
Cervical insufficiency,3rd
OB History

Gravidity:    7         Term:   2        Prem:   1        SAB:   2
TOP:          1       Ectopic:  0        Living: 3
Fetal Evaluation

Num Of Fetuses:     1
Fetal Heart         171
Rate(bpm):
Cardiac Activity:   Observed
Presentation:       Cephalic
Placenta:           Posterior, above cervical os

Amniotic Fluid
AFI FV:      Subjectively within normal limits

AFI Sum(cm)     %Tile       Largest Pocket(cm)
9.27            5
RUQ(cm)                     LUQ(cm)        LLQ(cm)
3.13
Gestational Age

LMP:           27w 5d       Date:   07/08/15                 EDD:   04/13/16
Best:          27w 5d    Det. By:   LMP  (07/08/15)          EDD:   04/13/16
Cervix Uterus Adnexa

Cervix
Length:            0.6  cm.
Funneling of internal os noted.

Left Ovary
Not visualized. No adnexal mass visualized.

Right Ovary
Not visualized. No adnexal mass visualized.
Impression

SIUP at 27+5 weeks
Normal amniotic fluid volume
EV views of cervix: U-shaped funneling with distal closed
portion measuring 
 6 mms; suture visualized and appeared
to be intact

(S/P 2 courses of BMZ)
Recommendations

Follow-up as clinically indicated
Follow cervix clinically going forward

## 2017-05-10 ENCOUNTER — Ambulatory Visit (INDEPENDENT_AMBULATORY_CARE_PROVIDER_SITE_OTHER): Payer: Commercial Managed Care - PPO | Admitting: Family Medicine

## 2017-05-10 ENCOUNTER — Encounter: Payer: Self-pay | Admitting: Family Medicine

## 2017-05-10 VITALS — BP 108/60 | HR 99 | Temp 98.7°F | Resp 17 | Ht 63.31 in | Wt 205.2 lb

## 2017-05-10 DIAGNOSIS — E668 Other obesity: Secondary | ICD-10-CM | POA: Diagnosis not present

## 2017-05-10 DIAGNOSIS — Z1321 Encounter for screening for nutritional disorder: Secondary | ICD-10-CM

## 2017-05-10 DIAGNOSIS — Z13220 Encounter for screening for lipoid disorders: Secondary | ICD-10-CM

## 2017-05-10 DIAGNOSIS — Z0000 Encounter for general adult medical examination without abnormal findings: Secondary | ICD-10-CM | POA: Diagnosis not present

## 2017-05-10 DIAGNOSIS — Z131 Encounter for screening for diabetes mellitus: Secondary | ICD-10-CM

## 2017-05-10 NOTE — Progress Notes (Signed)
Chief Complaint  Patient presents with  . Cpe    without pap/ declines flu vaccine    Subjective:  Sonya Harrison is a 36 y.o. female here for a health maintenance visit.  Patient is established pt  Patient Active Problem List   Diagnosis Date Noted  . Pregnancy 04/06/2016    Past Medical History:  Diagnosis Date  . Anemia   . Blood transfusion without reported diagnosis   . GERD (gastroesophageal reflux disease)    tums prn  . Incompetent cervix   . 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 APPENDECTOMY  04/08/2011  . CERVICAL CERCLAGE     x2  . CERVICAL CERCLAGE  04/19/2012   Procedure: CERCLAGE CERVICAL;  Surgeon: Margarette Asal, MD;  Location: [facility];  Service: Gynecology;  Laterality: N/A;  . CERVICAL CERCLAGE N/A 10/14/2015   Procedure: CERCLAGE CERVICAL;  Surgeon: Molli Posey, MD;  Location: [facility];  Service: Gynecology;  Laterality: N/A;  . CESAREAN SECTION  03/07, 04/09, 12/11  . CESAREAN SECTION    . CESAREAN SECTION    . CESAREAN SECTION WITH BILATERAL TUBAL LIGATION Bilateral 04/06/2016   Procedure: CESAREAN SECTION WITH BILATERAL TUBAL LIGATION, CERCLAGE REMOVAL;  Surgeon: Molli Posey, MD;  Location: [facility];  Service: Obstetrics;  Laterality: Bilateral;  EDC 04/13/16 Champ Mungo, RNFA  . DILATION AND CURETTAGE OF UTERUS  05/23/2012   Procedure: DILATATION AND CURETTAGE;  Surgeon: Allena Katz, MD;  Location: [facility];  Service: Gynecology;  Laterality: N/A;     Outpatient Medications Prior to Visit  Medication Sig Dispense Refill  . acetaminophen (TYLENOL) 325 MG tablet Take 650 mg by mouth every 6 (six) hours as needed for mild pain or headache.    . ibuprofen (ADVIL,MOTRIN) 800 MG tablet Take 1 tablet (800 mg total) by mouth every 8 (eight) hours as needed for moderate pain. 30 tablet 0  . ferrous sulfate 325 (65 FE) MG tablet Take 325 mg by mouth daily  with breakfast.    . Prenatal Vit-Fe Fumarate-FA (PRENATAL MULTIVITAMIN) TABS tablet Take 1 tablet by mouth daily at 12 noon.    . progesterone (PROMETRIUM) 200 MG capsule Place 1 capsule vaginally at bedtime.     Marland Kitchen HYDROcodone-acetaminophen (NORCO/VICODIN) 5-325 MG tablet Take 1-2 tablets by mouth every 4 (four) hours as needed for moderate pain. (Patient not taking: Reported on 05/10/2017) 30 tablet 0   No facility-administered medications prior to visit.     Allergies  Allergen Reactions  . Contrast Media [Iodinated Diagnostic Agents] Hives     Family History  Problem Relation Age of Onset  . Hypertension Mother   . Hypertension Father      Health Habits: Dental Exam: up to date Eye Exam: up to date Exercise: occasionally Current exercise activities: walking/running Diet: balanced  Social History   Social History  . Marital status: Married    Spouse name: N/A  . Number of children: N/A  . Years of education: N/A   Occupational History  . Not on file.   Social History Main Topics  . Smoking status: Never Smoker  . Smokeless tobacco: Never Used  . Alcohol use No  . Drug use: No  . Sexual activity: Yes    Birth control/ protection: None     Comment: pregnant   Other Topics Concern  . Not on file   Social History Narrative  .  No narrative on file   History  Alcohol Use No   History  Smoking Status  . Never Smoker  Smokeless Tobacco  . Never Used   History  Drug Use No   Obstetricians [id number]  GYN: Sexual Health Menstrual status: regular menses LMP: Patient's last menstrual period was 04/14/2017. Last pap smear: see HM section History of abnormal pap smears:  Sexually active: with female partner Current contraception: tubal ligation  Health Maintenance: See under health Maintenance activity for review of completion dates as well. There is no immunization history for the selected administration types on file for this patient.    Depression  Screen-PHQ2/9 Depression screen [facility] 2/9 05/10/2017 01/12/2016 12/01/2015 11/11/2015 06/30/2015  Decreased Interest 0 0 0 0 0  Down, Depressed, Hopeless 0 0 0 0 0  PHQ - 2 Score 0 0 0 0 0       Depression Severity and Treatment Recommendations:  0-4= None  5-9= Mild / Treatment: Support, educate to call if worse; return in one month  10-14= Moderate / Treatment: Support, watchful waiting; Antidepressant or Psycotherapy  15-19= Moderately severe / Treatment: Antidepressant OR Psychotherapy  >= 20 = Major depression, severe / Antidepressant AND Psychotherapy    Review of Systems   Review of Systems  Constitutional: Negative for chills and fever.  HENT: Negative for ear pain, hearing loss and tinnitus.   Eyes: Negative for blurred vision, double vision and photophobia.  Respiratory: Negative for cough, shortness of breath and wheezing.   Cardiovascular: Negative for chest pain, palpitations, orthopnea, claudication and leg swelling.  Gastrointestinal: Negative for abdominal pain, constipation, diarrhea, heartburn, nausea and vomiting.  Genitourinary: Negative for dysuria, frequency, hematuria and urgency.  Skin: Negative for itching and rash.  Neurological: Negative for dizziness, tingling and headaches.  Psychiatric/Behavioral: Negative for depression. The patient is not nervous/anxious and does not have insomnia.     See HPI for ROS as well.    Objective:   Vitals:   05/10/17 0948  BP: 108/60  Pulse: 99  Resp: 17  Temp: 98.7 F (37.1 C)  TempSrc: Oral  SpO2: 97%  Weight: 205 lb 3.2 oz (93.1 kg)  Height: 5' 3.31" (1.608 m)    Body mass index is 36 kg/m.  Physical Exam  BP 108/60 (BP Location: Left Arm, Patient Position: Sitting, Cuff Size: Large)   Pulse 99   Temp 98.7 F (37.1 C) (Oral)   Resp 17   Ht 5' 3.31" (1.608 m)   Wt 205 lb 3.2 oz (93.1 kg)   LMP 04/14/2017   SpO2 97%   BMI 36.00 kg/m   General Appearance:    Alert, cooperative, no distress,  appears stated age  Head:    Normocephalic, without obvious abnormality, atraumatic  Eyes:    PERRL, conjunctiva/corneas clear, EOM's intact, fundi    benign, both eyes  Ears:    Normal TM's and external ear canals, both ears  Nose:   Nares normal, septum midline, mucosa normal, no drainage    or sinus tenderness  Throat:   Lips, mucosa, and tongue normal; teeth and gums normal  Neck:   Supple, symmetrical, trachea midline, no adenopathy;    thyroid:  no enlargement/tenderness/nodules; no carotid   bruit or JVD  Back:     Symmetric, no curvature, ROM normal, no CVA tenderness  Lungs:     Clear to auscultation bilaterally, respirations unlabored  Chest Wall:    No tenderness or deformity   Heart:    Regular rate  and rhythm, S1 and S2 normal, no murmur, rub   or gallop  Breast Exam:    No tenderness, masses, or nipple abnormality  Abdomen:     Soft, non-tender, bowel sounds active all four quadrants,    no masses, no organomegaly  Extremities:   Extremities normal, atraumatic, no cyanosis or edema  Pulses:   2+ and symmetric all extremities  Skin:   Skin color, texture, turgor normal, no rashes or lesions  Neurologic:   CNII-XII intact, normal strength, sensation and reflexes    throughout      Assessment/Plan:   Patient was seen for a health maintenance exam.  Counseled the patient on health maintenance issues. Reviewed her health mainteance schedule and ordered appropriate tests (see orders.) Counseled on regular exercise and weight management. Recommend regular eye exams and dental cleaning.   The following issues were addressed today for health maintenance:   Sonya Harrison was seen today for cpe.  Diagnoses and all orders for this visit:  Encounter for health maintenance examination in adult -     CBC  Encounter for vitamin deficiency screening -     VITAMIN D 25 Hydroxy (Vit-D Deficiency, Fractures)  Screening, lipid -     Lipid panel  Screening for diabetes mellitus -      Hemoglobin A1c  Moderate obesity- discussed weight loss Body mass index is 36 kg/m.  -     Lipid panel -     Comprehensive metabolic panel -     TSH  Other orders- declined flu -     Cancel: Flu Vaccine QUAD 36+ mos IM    No Follow-up on file.    Body mass index is 36 kg/m.:  Discussed the patient's BMI with patient. The BMI body mass index is 36 kg/m.     No future appointments.  Patient Instructions       IF you received an x-ray today, you will receive an invoice from [facility]. Please contact [facility] at [phone] with questions or concerns regarding your invoice.   IF you received labwork today, you will receive an invoice from [facility]. Please contact [facility] at [phone] with questions or concerns regarding your invoice.   Our billing staff will not be able to assist you with questions regarding bills from these companies.  You will be contacted with the lab results as soon as they are available. The fastest way to get your results is to activate your My Chart account. Instructions are located on the last page of this paperwork. If you have not heard from [place] regarding the results in 2 weeks, please contact this office.     Preventive Care 18-39 Years, Female Preventive care refers to lifestyle choices and visits with your health care provider that can promote health and wellness. What does preventive care include?  A yearly physical exam. This is also called an annual well check.  Dental exams once or twice a year.  Routine eye exams. Ask your health care provider how often you should have your eyes checked.  Personal lifestyle choices, including: ? Daily care of your teeth and gums. ? Regular physical activity. ? Eating a healthy diet. ? Avoiding tobacco and drug use. ? Limiting alcohol use. ? Practicing safe sex. ? Taking vitamin and mineral supplements as recommended by your health care provider. What happens during  an annual well check? The services and screenings done by your health care provider during your annual well check will depend on your age, overall  health, lifestyle risk factors, and family history of disease. Counseling Your health care provider may ask you questions about your:  Alcohol use.  Tobacco use.  Drug use.  Emotional well-being.  Home and relationship well-being.  Sexual activity.  Eating habits.  Work and work Statistician.  Method of birth control.  Menstrual cycle.  Pregnancy history.  Screening You may have the following tests or measurements:  Height, weight, and BMI.  Diabetes screening. This is done by checking your blood sugar (glucose) after you have not eaten for a while (fasting).  Blood pressure.  Lipid and cholesterol levels. These may be checked every 5 years starting at age 72.  Skin check.  Hepatitis C blood test.  Hepatitis B blood test.  Sexually transmitted disease (STD) testing.  BRCA-related cancer screening. This may be done if you have a family history of breast, ovarian, tubal, or peritoneal cancers.  Pelvic exam and Pap test. This may be done every 3 years starting at age 55. Starting at age 60, this may be done every 5 years if you have a Pap test in combination with an HPV test.  Discuss your test results, treatment options, and if necessary, the need for more tests with your health care provider. Vaccines Your health care provider may recommend certain vaccines, such as:  Influenza vaccine. This is recommended every year.  Tetanus, diphtheria, and acellular pertussis (Tdap, Td) vaccine. You may need a Td booster every 10 years.  Varicella vaccine. You may need this if you have not been vaccinated.  HPV vaccine. If you are 44 or younger, you may need three doses over 6 months.  Measles, mumps, and rubella (MMR) vaccine. You may need at least one dose of MMR. You may also need a second dose.  Pneumococcal 13-valent  conjugate (PCV13) vaccine. You may need this if you have certain conditions and were not previously vaccinated.  Pneumococcal polysaccharide (PPSV23) vaccine. You may need one or two doses if you smoke cigarettes or if you have certain conditions.  Meningococcal vaccine. One dose is recommended if you are age 41-21 years and a first-year college student living in a residence hall, or if you have one of several medical conditions. You may also need additional booster doses.  Hepatitis A vaccine. You may need this if you have certain conditions or if you travel or work in places where you may be exposed to hepatitis A.  Hepatitis B vaccine. You may need this if you have certain conditions or if you travel or work in places where you may be exposed to hepatitis B.  Haemophilus influenzae type b (Hib) vaccine. You may need this if you have certain risk factors.  Talk to your health care provider about which screenings and vaccines you need and how often you need them. This information is not intended to replace advice given to you by your health care provider. Make sure you discuss any questions you have with your health care provider. Document Released: 09/26/2001 Document Revised: 04/19/2016 Document Reviewed: 06/01/2015 Elsevier Interactive Patient Education  2017 [employer].

## 2017-05-10 NOTE — Patient Instructions (Addendum)
   IF you received an x-ray today, you will receive an invoice from [facility]. Please contact [facility] at [phone] with questions or concerns regarding your invoice.   IF you received labwork today, you will receive an invoice from [facility]. Please contact [facility] at [phone] with questions or concerns regarding your invoice.   Our billing staff will not be able to assist you with questions regarding bills from these companies.  You will be contacted with the lab results as soon as they are available. The fastest way to get your results is to activate your My Chart account. Instructions are located on the last page of this paperwork. If you have not heard from us regarding the results in 2 weeks, please contact this office.     Preventive Care 18-39 Years, Female Preventive care refers to lifestyle choices and visits with your health care provider that can promote health and wellness. What does preventive care include?  A yearly physical exam. This is also called an annual well check.  Dental exams once or twice a year.  Routine eye exams. Ask your health care provider how often you should have your eyes checked.  Personal lifestyle choices, including: ? Daily care of your teeth and gums. ? Regular physical activity. ? Eating a healthy diet. ? Avoiding tobacco and drug use. ? Limiting alcohol use. ? Practicing safe sex. ? Taking vitamin and mineral supplements as recommended by your health care provider. What happens during an annual well check? The services and screenings done by your health care provider during your annual well check will depend on your age, overall health, lifestyle risk factors, and family history of disease. Counseling Your health care provider may ask you questions about your:  Alcohol use.  Tobacco use.  Drug use.  Emotional well-being.  Home and relationship well-being.  Sexual activity.  Eating habits.  Work  and work environment.  Method of birth control.  Menstrual cycle.  Pregnancy history.  Screening You may have the following tests or measurements:  Height, weight, and BMI.  Diabetes screening. This is done by checking your blood sugar (glucose) after you have not eaten for a while (fasting).  Blood pressure.  Lipid and cholesterol levels. These may be checked every 5 years starting at age 20.  Skin check.  Hepatitis C blood test.  Hepatitis B blood test.  Sexually transmitted disease (STD) testing.  BRCA-related cancer screening. This may be done if you have a family history of breast, ovarian, tubal, or peritoneal cancers.  Pelvic exam and Pap test. This may be done every 3 years starting at age 21. Starting at age 30, this may be done every 5 years if you have a Pap test in combination with an HPV test.  Discuss your test results, treatment options, and if necessary, the need for more tests with your health care provider. Vaccines Your health care provider may recommend certain vaccines, such as:  Influenza vaccine. This is recommended every year.  Tetanus, diphtheria, and acellular pertussis (Tdap, Td) vaccine. You may need a Td booster every 10 years.  Varicella vaccine. You may need this if you have not been vaccinated.  HPV vaccine. If you are 26 or younger, you may need three doses over 6 months.  Measles, mumps, and rubella (MMR) vaccine. You may need at least one dose of MMR. You may also need a second dose.  Pneumococcal 13-valent conjugate (PCV13) vaccine. You may need this if you have certain conditions and   were not previously vaccinated.  Pneumococcal polysaccharide (PPSV23) vaccine. You may need one or two doses if you smoke cigarettes or if you have certain conditions.  Meningococcal vaccine. One dose is recommended if you are age 19-21 years and a first-year college student living in a residence hall, or if you have one of several medical conditions.  You may also need additional booster doses.  Hepatitis A vaccine. You may need this if you have certain conditions or if you travel or work in places where you may be exposed to hepatitis A.  Hepatitis B vaccine. You may need this if you have certain conditions or if you travel or work in places where you may be exposed to hepatitis B.  Haemophilus influenzae type b (Hib) vaccine. You may need this if you have certain risk factors.  Talk to your health care provider about which screenings and vaccines you need and how often you need them. This information is not intended to replace advice given to you by your health care provider. Make sure you discuss any questions you have with your health care provider. Document Released: 09/26/2001 Document Revised: 04/19/2016 Document Reviewed: 06/01/2015 Elsevier Interactive Patient Education  2017 [employer] Inc[employer]  

## 2017-05-11 ENCOUNTER — Other Ambulatory Visit: Payer: Self-pay | Admitting: Family Medicine

## 2017-05-11 LAB — COMPREHENSIVE METABOLIC PANEL
A/G RATIO: 1.3 (ref 1.2–2.2)
ALK PHOS: 71 IU/L (ref 39–117)
ALT: 34 IU/L — AB (ref 0–32)
AST: 26 IU/L (ref 0–40)
Albumin: 4.4 g/dL (ref 3.5–5.5)
BILIRUBIN TOTAL: 0.4 mg/dL (ref 0.0–1.2)
BUN/Creatinine Ratio: 15 (ref 9–23)
BUN: 11 mg/dL (ref 6–20)
CHLORIDE: 103 mmol/L (ref 96–106)
CO2: 22 mmol/L (ref 20–29)
Calcium: 9.3 mg/dL (ref 8.7–10.2)
Creatinine, Ser: 0.72 mg/dL (ref 0.57–1.00)
GFR calc Af Amer: 123 mL/min/{1.73_m2} (ref >59)
GFR calc non Af Amer: 107 mL/min/{1.73_m2} (ref >59)
GLUCOSE: 89 mg/dL (ref 65–99)
Globulin, Total: 3.5 g/dL (ref 1.5–4.5)
POTASSIUM: 4.6 mmol/L (ref 3.5–5.2)
Sodium: 141 mmol/L (ref 134–144)
Total Protein: 7.9 g/dL (ref 6.0–8.5)

## 2017-05-11 LAB — CBC
Hematocrit: 36.8 % (ref 34.0–46.6)
Hemoglobin: 12.2 g/dL (ref 11.1–15.9)
MCH: 25.8 pg — AB (ref 26.6–33.0)
MCHC: 33.2 g/dL (ref 31.5–35.7)
MCV: 78 fL — AB (ref 79–97)
PLATELETS: 427 10*3/uL — AB (ref 150–379)
RBC: 4.73 x10E6/uL (ref 3.77–5.28)
RDW: 15.5 % — AB (ref 12.3–15.4)
WBC: 8 10*3/uL (ref 3.4–10.8)

## 2017-05-11 LAB — LIPID PANEL
CHOLESTEROL TOTAL: 178 mg/dL (ref 100–199)
Chol/HDL Ratio: 4 ratio (ref 0.0–4.4)
HDL: 44 mg/dL (ref >39)
LDL Calculated: 121 mg/dL — ABNORMAL HIGH (ref 0–99)
Triglycerides: 65 mg/dL (ref 0–149)
VLDL CHOLESTEROL CAL: 13 mg/dL (ref 5–40)

## 2017-05-11 LAB — TSH: TSH: 1.56 u[IU]/mL (ref 0.450–4.500)

## 2017-05-11 LAB — HEMOGLOBIN A1C
ESTIMATED AVERAGE GLUCOSE: 117 mg/dL
Hgb A1c MFr Bld: 5.7 % — ABNORMAL HIGH (ref 4.8–5.6)

## 2017-05-11 LAB — VITAMIN D 25 HYDROXY (VIT D DEFICIENCY, FRACTURES): VIT D 25 HYDROXY: 14.4 ng/mL — AB (ref 30.0–100.0)

## 2017-05-11 MED ORDER — VITAMIN D (ERGOCALCIFEROL) 1.25 MG (50000 UNIT) PO CAPS: 50000.0000 [IU] | ORAL_CAPSULE | ORAL | 0 refills | Status: DC

## 2017-06-13 ENCOUNTER — Encounter: Payer: Self-pay | Admitting: Family Medicine

## 2017-08-02 NOTE — Progress Notes (Signed)
Letter sent.

## 2017-12-03 ENCOUNTER — Other Ambulatory Visit: Payer: Self-pay | Admitting: Family Medicine

## 2017-12-03 NOTE — Telephone Encounter (Signed)
Unclear per lab notes if patient needs a repeat lab or repeat office visit for 6 months of taking Vit D.  Last OV:05/10/17 Last filled: 05/10/17 PCP: Creta LevinStallings Pharmacy: [facility] [phone][facility][id number] [place]- [place], [place] - [address] [phone] (Phone) [phone] (Fax)

## 2017-12-04 NOTE — Telephone Encounter (Signed)
Please advise.  Does this pt need a lab visit to recheck vitamin d levels before refilling or does she need and office visit?

## 2017-12-05 NOTE — Telephone Encounter (Signed)
Called pt and made an appt to see Dr. Creta LevinStallings on Monday 4/29 for labs and office visit.

## 2017-12-10 ENCOUNTER — Ambulatory Visit: Payer: Commercial Managed Care - PPO | Admitting: Family Medicine

## 2017-12-10 ENCOUNTER — Other Ambulatory Visit: Payer: Self-pay

## 2017-12-10 ENCOUNTER — Encounter: Payer: Self-pay | Admitting: Family Medicine

## 2017-12-10 DIAGNOSIS — D573 Sickle-cell trait: Secondary | ICD-10-CM

## 2017-12-10 DIAGNOSIS — E559 Vitamin D deficiency, unspecified: Secondary | ICD-10-CM

## 2017-12-10 DIAGNOSIS — D649 Anemia, unspecified: Secondary | ICD-10-CM

## 2017-12-10 DIAGNOSIS — R7303 Prediabetes: Secondary | ICD-10-CM | POA: Diagnosis not present

## 2017-12-10 NOTE — Progress Notes (Signed)
Chief Complaint  Patient presents with  . labs/ov/medication refills    needs refill on vitamin d    HPI   sickle cell trait and iron deficiency anemia She does not take iron  One of her children is also a trait She denies chest pain, shortness of breath or blood clots, she does not have heavy periods  Vitamin D def She also has low vitamin D. She denies fatigue. She took her vitamin d until she ran out of her prescription. She denies history of kidney stones.  Obesity and Prediabetes She denies any signs of diabetes like polyuria, polydipsia or polyphagia She is working on her weight loss to prevent diabetes and cholesterol She does not exercise regularly   Body mass index is 34.94 kg/m. Wt Readings from Last 3 Encounters:  12/10/17 199 lb 3.2 oz (90.4 kg)  05/10/17 205 lb 3.2 oz (93.1 kg)  03/29/16 216 lb (98 kg)    Patient's last menstrual period was 12/10/2017.  Body mass index is 34.94 kg/m.    Past Medical History:  Diagnosis Date  . Anemia   . Blood transfusion without reported diagnosis   . GERD (gastroesophageal reflux disease)    tums prn  . Incompetent cervix   . 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 Vitamin D, Ergocalciferol, (DRISDOL) 50000 units CAPS capsule Take 1 capsule (50,000 Units total) by mouth every 7 (seven) days. 30 capsule 0  . acetaminophen (TYLENOL) 325 MG tablet Take 650 mg by mouth every 6 (six) hours as needed for mild pain or headache.    . ferrous sulfate 325 (65 FE) MG tablet Take 325 mg by mouth daily with breakfast.    . ibuprofen (ADVIL,MOTRIN) 800 MG tablet Take 1 tablet (800 mg total) by mouth every 8 (eight) hours as needed for moderate pain. (Patient not taking: Reported on 12/10/2017) 30 tablet 0  . Prenatal Vit-Fe Fumarate-FA (PRENATAL MULTIVITAMIN) TABS tablet Take 1 tablet by mouth daily at 12 noon.    . progesterone  (PROMETRIUM) 200 MG capsule Place 1 capsule vaginally at bedtime.      No current facility-administered medications for this visit.     Allergies:  Allergies  Allergen Reactions  . Contrast Media [Iodinated Diagnostic Agents] Hives    Past Surgical History:  Procedure Laterality Date  . APPENDECTOMY  04/08/2011  . CERVICAL CERCLAGE     x2  . CERVICAL CERCLAGE  04/19/2012   Procedure: CERCLAGE CERVICAL;  Surgeon: [name], MD;  Location: [facility];  Service: Gynecology;  Laterality: N/A;  . CERVICAL CERCLAGE N/A 10/14/2015   Procedure: CERCLAGE CERVICAL;  Surgeon: Richarda Overlie, MD;  Location: [facility];  Service: Gynecology;  Laterality: N/A;  . CESAREAN SECTION  03/07, 04/09, 12/11  . CESAREAN SECTION    . CESAREAN SECTION    . CESAREAN SECTION WITH BILATERAL TUBAL LIGATION Bilateral 04/06/2016   Procedure: CESAREAN SECTION WITH BILATERAL TUBAL LIGATION, CERCLAGE REMOVAL;  Surgeon: Richarda Overlie, MD;  Location: [facility];  Service: Obstetrics;  Laterality: Bilateral;  EDC 04/13/16 Curly Rim, RNFA  . DILATION AND CURETTAGE OF UTERUS  05/23/2012   Procedure: DILATATION AND CURETTAGE;  Surgeon: Leslie Andrea, MD;  Location: [facility];  Service: Gynecology;  Laterality: N/A;    Social History   Socioeconomic History  . Marital status: Married    Spouse name: Not on file  . Number of  children: Not on file  . Years of education: Not on file  . Highest education level: Not on file  Occupational History  . Not on file  Social Needs  . Financial resource strain: Not on file  . Food insecurity:    Worry: Not on file    Inability: Not on file  . Transportation needs:    Medical: Not on file    Non-medical: Not on file  Tobacco Use  . Smoking status: Never Smoker  . Smokeless tobacco: Never Used  Substance and Sexual Activity  . Alcohol use: No  . Drug use: No  . Sexual activity: Yes    Birth control/protection: None    Comment: pregnant  Lifestyle  .  Physical activity:    Days per week: Not on file    Minutes per session: Not on file  . Stress: Not on file  Relationships  . Social connections:    Talks on phone: Not on file    Gets together: Not on file    Attends religious service: Not on file    Active member of club or organization: Not on file    Attends meetings of clubs or organizations: Not on file    Relationship status: Not on file  Other Topics Concern  . Not on file  Social History Narrative  . Not on file    Family History  Problem Relation Age of Onset  . Hypertension Mother   . Hypertension Father      ROS Review of Systems See HPI Constitution: No fevers or chills No malaise No diaphoresis Skin: No rash or itching Eyes: no blurry vision, no double vision GU: no dysuria or hematuria Neuro: no dizziness or headaches all others reviewed and negative   Objective: Vitals:   12/10/17 1527  BP: 114/82  Pulse: 85  Resp: 17  Temp: 98.9 F (37.2 C)  TempSrc: Oral  SpO2: 99%  Weight: 199 lb 3.2 oz (90.4 kg)  Height: 5' 3.31" (1.608 m)    Physical Exam  Constitutional: She is oriented to person, place, and time. She appears well-developed and well-nourished.  HENT:  Head: Normocephalic and atraumatic.  Eyes: Conjunctivae and EOM are normal.  Neck: Normal range of motion. Neck supple. No thyromegaly present.  Cardiovascular: Normal rate, regular rhythm and normal heart sounds.  No murmur heard. Pulmonary/Chest: Effort normal and breath sounds normal. No stridor. No respiratory distress.  Neurological: She is alert and oriented to person, place, and time.  Skin: Skin is warm. Capillary refill takes less than 2 seconds.  Psychiatric: She has a normal mood and affect. Her behavior is normal. Judgment and thought content normal.     Assessment and Plan Cia was seen today for labs/ov/medication refills.  Diagnoses and all orders for this visit:  Morbid obesity (HCC)- improving -     Lipid  panel  Vitamin D insufficiency- discussed an otc multivitamin -     VITAMIN D 25 Hydroxy (Vit-D Deficiency, Fractures)  Sickle cell trait (HCC) Mild anemia Iron supplement on menstrual days advised Avoid dehydration  -     CBC  Prediabetes-  Pt aware and making good choices -     Comprehensive metabolic panel -     Hemoglobin A1c     Maryjane Benedict A [place] Parco

## 2017-12-10 NOTE — Patient Instructions (Addendum)
Prediabetes information:  Check out [email or web address]    IF you received an x-ray today, you will receive an invoice from [facility]. Please contact [facility] at [phone] with questions or concerns regarding your invoice.   IF you received labwork today, you will receive an invoice from [facility]. Please contact [facility] at [phone] with questions or concerns regarding your invoice.   Our billing staff will not be able to assist you with questions regarding bills from these companies.  You will be contacted with the lab results as soon as they are available. The fastest way to get your results is to activate your My Chart account. Instructions are located on the last page of this paperwork. If you have not heard from [place] regarding the results in 2 weeks, please contact this office.     Vitamin D Deficiency Vitamin D deficiency is when your body does not have enough vitamin D. Vitamin D is important to your body for many reasons:  It helps the body to absorb two important minerals, called calcium and phosphorus.  It plays a role in bone health.  It may help to prevent some diseases, such as diabetes and multiple sclerosis.  It plays a role in muscle function, including heart function.  You can get vitamin D by:  Eating foods that naturally contain vitamin D.  Eating or drinking milk or other dairy products that have vitamin D added to them.  Taking a vitamin D supplement or a multivitamin supplement that contains vitamin D.  Being in the sun. Your body naturally makes vitamin D when your skin is exposed to sunlight. Your body changes the sunlight into a form of the vitamin that the body can use.  If vitamin D deficiency is severe, it can cause a condition in which your bones become soft. In adults, this condition is called osteomalacia. In children, this condition is called rickets. What are the causes? Vitamin D deficiency may be caused by:  Not eating  enough foods that contain vitamin D.  Not getting enough sun exposure.  Having certain digestive system diseases that make it difficult for your body to absorb vitamin D. These diseases include Crohn disease, chronic pancreatitis, and cystic fibrosis.  Having a surgery in which a part of the stomach or a part of the small intestine is removed.  Being obese.  Having chronic kidney disease or liver disease.  What increases the risk? This condition is more likely to develop in:  Older people.  People who do not spend much time outdoors.  People who live in a long-term care facility.  People who have had broken bones.  People with weak or thin bones (osteoporosis).  People who have a disease or condition that changes how the body absorbs vitamin D.  People who have dark skin.  People who take certain medicines, such as steroid medicines or certain seizure medicines.  People who are overweight or obese.  What are the signs or symptoms? In mild cases of vitamin D deficiency, there may not be any symptoms. If the condition is severe, symptoms may include:  Bone pain.  Muscle pain.  Falling often.  Broken bones caused by a minor injury.  How is this diagnosed? This condition is usually diagnosed with a blood test. How is this treated? Treatment for this condition may depend on what caused the condition. Treatment options include:  Taking vitamin D supplements.  Taking a calcium supplement. Your health care provider will suggest what dose is best for  you.  Follow these instructions at home:  Take medicines and supplements only as told by your health care provider.  Eat foods that contain vitamin D. Choices include: ? Fortified dairy products, cereals, or juices. Fortified means that vitamin D has been added to the food. Check the label on the package to be sure. ? Fatty fish, such as salmon or trout. ? Eggs. ? Oysters.  Do not use a tanning bed.  Maintain a  healthy weight. Lose weight, if needed.  Keep all follow-up visits as told by your health care provider. This is important. Contact a health care provider if:  Your symptoms do not go away.  You feel like throwing up (nausea) or you throw up (vomit).  You have fewer bowel movements than usual or it is difficult for you to have a bowel movement (constipation). This information is not intended to replace advice given to you by your health care provider. Make sure you discuss any questions you have with your health care provider. Document Released: 10/23/2011 Document Revised: 01/12/2016 Document Reviewed: 12/16/2014 Elsevier Interactive Patient Education  2018 [employer].

## 2017-12-11 LAB — COMPREHENSIVE METABOLIC PANEL
ALBUMIN: 4.3 g/dL (ref 3.5–5.5)
ALK PHOS: 71 IU/L (ref 39–117)
ALT: 23 IU/L (ref 0–32)
AST: 22 IU/L (ref 0–40)
Albumin/Globulin Ratio: 1.3 (ref 1.2–2.2)
BUN / CREAT RATIO: 20 (ref 9–23)
BUN: 15 mg/dL (ref 6–20)
Bilirubin Total: 0.3 mg/dL (ref 0.0–1.2)
CO2: 24 mmol/L (ref 20–29)
CREATININE: 0.74 mg/dL (ref 0.57–1.00)
Calcium: 9.6 mg/dL (ref 8.7–10.2)
Chloride: 102 mmol/L (ref 96–106)
GFR calc Af Amer: 118 mL/min/{1.73_m2} (ref >59)
GFR calc non Af Amer: 102 mL/min/{1.73_m2} (ref >59)
GLOBULIN, TOTAL: 3.3 g/dL (ref 1.5–4.5)
Glucose: 79 mg/dL (ref 65–99)
Potassium: 4.8 mmol/L (ref 3.5–5.2)
SODIUM: 139 mmol/L (ref 134–144)
Total Protein: 7.6 g/dL (ref 6.0–8.5)

## 2017-12-11 LAB — LIPID PANEL
CHOLESTEROL TOTAL: 168 mg/dL (ref 100–199)
Chol/HDL Ratio: 4 ratio (ref 0.0–4.4)
HDL: 42 mg/dL (ref >39)
LDL Calculated: 112 mg/dL — ABNORMAL HIGH (ref 0–99)
TRIGLYCERIDES: 69 mg/dL (ref 0–149)
VLDL CHOLESTEROL CAL: 14 mg/dL (ref 5–40)

## 2017-12-11 LAB — CBC
HEMATOCRIT: 37.1 % (ref 34.0–46.6)
HEMOGLOBIN: 12 g/dL (ref 11.1–15.9)
MCH: 26.3 pg — AB (ref 26.6–33.0)
MCHC: 32.3 g/dL (ref 31.5–35.7)
MCV: 81 fL (ref 79–97)
Platelets: 452 10*3/uL — ABNORMAL HIGH (ref 150–379)
RBC: 4.57 x10E6/uL (ref 3.77–5.28)
RDW: 15.6 % — ABNORMAL HIGH (ref 12.3–15.4)
WBC: 7.9 10*3/uL (ref 3.4–10.8)

## 2017-12-11 LAB — VITAMIN D 25 HYDROXY (VIT D DEFICIENCY, FRACTURES): Vit D, 25-Hydroxy: 35.3 ng/mL (ref 30.0–100.0)

## 2017-12-11 LAB — HEMOGLOBIN A1C
Est. average glucose Bld gHb Est-mCnc: 111 mg/dL
HEMOGLOBIN A1C: 5.5 % (ref 4.8–5.6)

## 2017-12-12 ENCOUNTER — Ambulatory Visit: Payer: Commercial Managed Care - PPO | Admitting: Family Medicine

## 2017-12-24 ENCOUNTER — Encounter: Payer: Self-pay | Admitting: *Deleted

## 2018-05-08 ENCOUNTER — Other Ambulatory Visit: Payer: Self-pay

## 2018-05-08 ENCOUNTER — Encounter: Payer: Self-pay | Admitting: Urgent Care

## 2018-05-08 ENCOUNTER — Ambulatory Visit (INDEPENDENT_AMBULATORY_CARE_PROVIDER_SITE_OTHER): Payer: Commercial Managed Care - PPO | Admitting: Urgent Care

## 2018-05-08 VITALS — BP 128/86 | HR 99 | Temp 98.3°F | Resp 16 | Ht 63.31 in | Wt 212.2 lb

## 2018-05-08 DIAGNOSIS — D649 Anemia, unspecified: Secondary | ICD-10-CM

## 2018-05-08 DIAGNOSIS — Z0000 Encounter for general adult medical examination without abnormal findings: Secondary | ICD-10-CM | POA: Diagnosis not present

## 2018-05-08 DIAGNOSIS — R7303 Prediabetes: Secondary | ICD-10-CM

## 2018-05-08 DIAGNOSIS — E669 Obesity, unspecified: Secondary | ICD-10-CM

## 2018-05-08 DIAGNOSIS — D573 Sickle-cell trait: Secondary | ICD-10-CM

## 2018-05-08 NOTE — Patient Instructions (Addendum)
Salads - kale, spinach, cabbage, spring mix; use seeds like pumpkin seeds or sunflower seeds; you can also use 1-2 hard boiled eggs. Fruits - avocadoes, berries (blueberries, raspberries, blackberries), apples, oranges, pomegranate, grapefruit Seeds - quinoa, chia seeds; you can also incorporate oatmeal Vegetables - aspargus, cauliflower, broccoli, green beans, brussel spouts, bell peppers; stay away from starchy vegetables like potatoes, carrots, peas    Health Maintenance, Female Adopting a healthy lifestyle and getting preventive care can go a long way to promote health and wellness. Talk with your health care provider about what schedule of regular examinations is right for you. This is a good chance for you to check in with your provider about disease prevention and staying healthy. In between checkups, there are plenty of things you can do on your own. Experts have done a lot of research about which lifestyle changes and preventive measures are most likely to keep you healthy. Ask your health care provider for more information. Weight and diet Eat a healthy diet  Be sure to include plenty of vegetables, fruits, low-fat dairy products, and lean protein.  Do not eat a lot of foods high in solid fats, added sugars, or salt.  Get regular exercise. This is one of the most important things you can do for your health. ? Most adults should exercise for at least 150 minutes each week. The exercise should increase your heart rate and make you sweat (moderate-intensity exercise). ? Most adults should also do strengthening exercises at least twice a week. This is in addition to the moderate-intensity exercise.  Maintain a healthy weight  Body mass index (BMI) is a measurement that can be used to identify possible weight problems. It estimates body fat based on height and weight. Your health care provider can help determine your BMI and help you achieve or maintain a healthy weight.  For females  15 years of age and older: ? A BMI below 18.5 is considered underweight. ? A BMI of 18.5 to 24.9 is normal. ? A BMI of 25 to 29.9 is considered overweight. ? A BMI of 30 and above is considered obese.  Watch levels of cholesterol and blood lipids  You should start having your blood tested for lipids and cholesterol at 39 years of age, then have this test every 5 years.  You may need to have your cholesterol levels checked more often if: ? Your lipid or cholesterol levels are high. ? You are older than 39 years of age. ? You are at high risk for heart disease.  Cancer screening Lung Cancer  Lung cancer screening is recommended for adults 39-72 years old who are at high risk for lung cancer because of a history of smoking.  A yearly low-dose CT scan of the lungs is recommended for people who: ? Currently smoke. ? Have quit within the past 15 years. ? Have at least a 30-pack-year history of smoking. A pack year is smoking an average of one pack of cigarettes a day for 1 year.  Yearly screening should continue until it has been 15 years since you quit.  Yearly screening should stop if you develop a health problem that would prevent you from having lung cancer treatment.  Breast Cancer  Practice breast self-awareness. This means understanding how your breasts normally appear and feel.  It also means doing regular breast self-exams. Let your health care provider know about any changes, no matter how small.  If you are in your 20s or 30s, you should have  a clinical breast exam (CBE) by a health care provider every 1-3 years as part of a regular health exam.  If you are 68 or older, have a CBE every year. Also consider having a breast X-ray (mammogram) every year.  If you have a family history of breast cancer, talk to your health care provider about genetic screening.  If you are at high risk for breast cancer, talk to your health care provider about having an MRI and a mammogram  every year.  Breast cancer gene (BRCA) assessment is recommended for women who have family members with BRCA-related cancers. BRCA-related cancers include: ? Breast. ? Ovarian. ? Tubal. ? Peritoneal cancers.  Results of the assessment will determine the need for genetic counseling and BRCA1 and BRCA2 testing.  Cervical Cancer Your health care provider may recommend that you be screened regularly for cancer of the pelvic organs (ovaries, uterus, and vagina). This screening involves a pelvic examination, including checking for microscopic changes to the surface of your cervix (Pap test). You may be encouraged to have this screening done every 3 years, beginning at age 39.  For women ages 39-65, health care providers may recommend pelvic exams and Pap testing every 3 years, or they may recommend the Pap and pelvic exam, combined with testing for human papilloma virus (HPV), every 5 years. Some types of HPV increase your risk of cervical cancer. Testing for HPV may also be done on women of any age with unclear Pap test results.  Other health care providers may not recommend any screening for nonpregnant women who are considered low risk for pelvic cancer and who do not have symptoms. Ask your health care provider if a screening pelvic exam is right for you.  If you have had past treatment for cervical cancer or a condition that could lead to cancer, you need Pap tests and screening for cancer for at least 20 years after your treatment. If Pap tests have been discontinued, your risk factors (such as having a new sexual partner) need to be reassessed to determine if screening should resume. Some women have medical problems that increase the chance of getting cervical cancer. In these cases, your health care provider may recommend more frequent screening and Pap tests.  Colorectal Cancer  This type of cancer can be detected and often prevented.  Routine colorectal cancer screening usually begins at  39 years of age and continues through 39 years of age.  Your health care provider may recommend screening at an earlier age if you have risk factors for colon cancer.  Your health care provider may also recommend using home test kits to check for hidden blood in the stool.  A small camera at the end of a tube can be used to examine your colon directly (sigmoidoscopy or colonoscopy). This is done to check for the earliest forms of colorectal cancer.  Routine screening usually begins at age 72.  Direct examination of the colon should be repeated every 5-10 years through 39 years of age. However, you may need to be screened more often if early forms of precancerous polyps or small growths are found.  Skin Cancer  Check your skin from head to toe regularly.  Tell your health care provider about any new moles or changes in moles, especially if there is a change in a mole's shape or color.  Also tell your health care provider if you have a mole that is larger than the size of a pencil eraser.  Always use  sunscreen. Apply sunscreen liberally and repeatedly throughout the day.  Protect yourself by wearing long sleeves, pants, a wide-brimmed hat, and sunglasses whenever you are outside.  Heart disease, diabetes, and high blood pressure  High blood pressure causes heart disease and increases the risk of stroke. High blood pressure is more likely to develop in: ? People who have blood pressure in the high end of the normal range (130-139/85-89 mm Hg). ? People who are overweight or obese. ? People who are African American.  If you are 36-63 years of age, have your blood pressure checked every 3-5 years. If you are 6 years of age or older, have your blood pressure checked every year. You should have your blood pressure measured twice-once when you are at a hospital or clinic, and once when you are not at a hospital or clinic. Record the average of the two measurements. To check your blood  pressure when you are not at a hospital or clinic, you can use: ? An automated blood pressure machine at a pharmacy. ? A home blood pressure monitor.  If you are between 53 years and 85 years old, ask your health care provider if you should take aspirin to prevent strokes.  Have regular diabetes screenings. This involves taking a blood sample to check your fasting blood sugar level. ? If you are at a normal weight and have a low risk for diabetes, have this test once every three years after 39 years of age. ? If you are overweight and have a high risk for diabetes, consider being tested at a younger age or more often. Preventing infection Hepatitis B  If you have a higher risk for hepatitis B, you should be screened for this virus. You are considered at high risk for hepatitis B if: ? You were born in a country where hepatitis B is common. Ask your health care provider which countries are considered high risk. ? Your parents were born in a high-risk country, and you have not been immunized against hepatitis B (hepatitis B vaccine). ? You have HIV or AIDS. ? You use needles to inject street drugs. ? You live with someone who has hepatitis B. ? You have had sex with someone who has hepatitis B. ? You get hemodialysis treatment. ? You take certain medicines for conditions, including cancer, organ transplantation, and autoimmune conditions.  Hepatitis C  Blood testing is recommended for: ? Everyone born from [birth year] through 1967. ? Anyone with known risk factors for hepatitis C.  Sexually transmitted infections (STIs)  You should be screened for sexually transmitted infections (STIs) including gonorrhea and chlamydia if: ? You are sexually active and are younger than 39 years of age. ? You are older than 39 years of age and your health care provider tells you that you are at risk for this type of infection. ? Your sexual activity has changed since you were last screened and you are at an  increased risk for chlamydia or gonorrhea. Ask your health care provider if you are at risk.  If you do not have HIV, but are at risk, it may be recommended that you take a prescription medicine daily to prevent HIV infection. This is called pre-exposure prophylaxis (PrEP). You are considered at risk if: ? You are sexually active and do not regularly use condoms or know the HIV status of your partner(s). ? You take drugs by injection. ? You are sexually active with a partner who has HIV.  Talk with your health  care provider about whether you are at high risk of being infected with HIV. If you choose to begin PrEP, you should first be tested for HIV. You should then be tested every 3 months for as long as you are taking PrEP. Pregnancy  If you are premenopausal and you may become pregnant, ask your health care provider about preconception counseling.  If you may become pregnant, take 400 to 800 micrograms (mcg) of folic acid every day.  If you want to prevent pregnancy, talk to your health care provider about birth control (contraception). Osteoporosis and menopause  Osteoporosis is a disease in which the bones lose minerals and strength with aging. This can result in serious bone fractures. Your risk for osteoporosis can be identified using a bone density scan.  If you are 23 years of age or older, or if you are at risk for osteoporosis and fractures, ask your health care provider if you should be screened.  Ask your health care provider whether you should take a calcium or vitamin D supplement to lower your risk for osteoporosis.  Menopause may have certain physical symptoms and risks.  Hormone replacement therapy may reduce some of these symptoms and risks. Talk to your health care provider about whether hormone replacement therapy is right for you. Follow these instructions at home:  Schedule regular health, dental, and eye exams.  Stay current with your immunizations.  Do not use  any tobacco products including cigarettes, chewing tobacco, or electronic cigarettes.  If you are pregnant, do not drink alcohol.  If you are breastfeeding, limit how much and how often you drink alcohol.  Limit alcohol intake to no more than 1 drink per day for nonpregnant women. One drink equals 12 ounces of beer, 5 ounces of wine, or 1 ounces of hard liquor.  Do not use street drugs.  Do not share needles.  Ask your health care provider for help if you need support or information about quitting drugs.  Tell your health care provider if you often feel depressed.  Tell your health care provider if you have ever been abused or do not feel safe at home. This information is not intended to replace advice given to you by your health care provider. Make sure you discuss any questions you have with your health care provider. Document Released: 02/13/2011 Document Revised: 01/06/2016 Document Reviewed: 05/04/2015 Elsevier Interactive Patient Education  [employer].     If you have lab work done today you will be contacted with your lab results within the next 2 weeks.  If you have not heard from [place] then please contact us. The fastest way to get your results is to register for My Chart.   IF you received an x-ray today, you will receive an invoice from [facility]. Please contact [facility] at [phone] with questions or concerns regarding your invoice.   IF you received labwork today, you will receive an invoice from [place]. Please contact [facility] at [phone] with questions or concerns regarding your invoice.   Our billing staff will not be able to assist you with questions regarding bills from these companies.  You will be contacted with the lab results as soon as they are available. The fastest way to get your results is to activate your My Chart account. Instructions are located on the last page of this paperwork. If you have not heard from [place]  regarding the results in 2 weeks, please contact this office.

## 2018-05-08 NOTE — Progress Notes (Signed)
MRN: [id number]  Subjective:   Sonya Harrison is a 37 y.o. female presenting for annual physical exam. Patient is an [employer] fundraiser, works at a rehabilitation facility. She is happily married. Has good relationships at home, has a good support network. Does not actively practice a healthy diet, does not routinely exercise.   Medical care team includes: PCP: Doristine Bosworth, MD Vision: No visual deficits. Dental: Does not get consistent dental care. OB/GYN: Has a scheduled pap smear with her gynecologist next month (05/2018). Specialists: None.   Sonya Harrison is not currently taking any medications.  She is allergic to contrast media [iodinated diagnostic agents]. 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 Also  has a past surgical history that includes Cesarean section (03/07, 04/09, 12/11); Appendectomy (04/08/2011); Cervical cerclage; Cervical cerclage (04/19/2012); Dilation and curettage of uterus (05/23/2012); Cervical cerclage (N/A, 10/14/2015); Cesarean section; Cesarean section; and Cesarean section with bilateral tubal ligation (Bilateral, 04/06/2016). Her family history includes Hypertension in her father and mother.  Immunizations: Will obtain flu shot through her employer.   Review of Systems  Constitutional: Negative for chills, diaphoresis, fever, malaise/fatigue and weight loss.  HENT: Negative for congestion, ear discharge, ear pain, hearing loss, nosebleeds, sore throat and tinnitus.   Eyes: Negative for blurred vision, double vision, photophobia, pain, discharge and redness.  Respiratory: Negative for cough, shortness of breath and wheezing.   Cardiovascular: Negative for chest pain, palpitations and leg swelling.  Gastrointestinal: Negative for abdominal pain, blood in stool, constipation,  diarrhea, nausea and vomiting.  Genitourinary: Negative for dysuria, flank pain, frequency, hematuria and urgency.  Musculoskeletal: Negative for back pain, joint pain and myalgias.  Skin: Negative for itching and rash.  Neurological: Negative for dizziness, tingling, seizures, loss of consciousness, weakness and headaches.  Endo/Heme/Allergies: Negative for polydipsia.  Psychiatric/Behavioral: Negative for depression, hallucinations, memory loss, substance abuse and suicidal ideas. The patient is not nervous/anxious and does not have insomnia.     Objective:   Vitals: BP 128/86 (BP Location: Right Arm, Patient Position: Sitting, Cuff Size: Large)   Pulse 99   Temp 98.3 F (36.8 C) (Oral)   Resp 16   Ht 5' 3.31" (1.608 m)   Wt 212 lb 3.2 oz (96.3 kg)   LMP 04/24/2018   SpO2 95%   BMI 37.22 kg/m   Wt Readings from Last 3 Encounters:  05/08/18 212 lb 3.2 oz (96.3 kg)  12/10/17 199 lb 3.2 oz (90.4 kg)  05/10/17 205 lb 3.2 oz (93.1 kg)    Physical Exam  Constitutional: She is oriented to person, place, and time. She appears well-developed and well-nourished.  HENT:  TM's intact bilaterally, no effusions or erythema. Nasal turbinates pink and moist, nasal passages patent. No sinus tenderness. Oropharynx clear, mucous membranes moist, dentition in good repair.  Eyes: Pupils are equal, round, and reactive to light. Conjunctivae and EOM are normal. Right eye exhibits no discharge. Left eye exhibits no discharge. No scleral icterus.  Neck: Normal range of motion. Neck supple. No thyromegaly present.  Cardiovascular: Normal rate, regular rhythm, normal heart sounds and intact distal pulses. Exam reveals no gallop and no friction rub.  No murmur heard. Pulmonary/Chest: Effort normal and breath sounds normal. No stridor. No respiratory distress. She has no wheezes. She has no rales.  Abdominal: Soft. Bowel sounds are normal. She exhibits no distension and  no mass. There is no tenderness.  There is no rebound and no guarding.  Musculoskeletal: Normal range of motion. She exhibits no edema or tenderness.  Lymphadenopathy:    She has no cervical adenopathy.  Neurological: She is alert and oriented to person, place, and time. She has normal reflexes. She displays normal reflexes. Coordination normal.  Skin: Skin is warm and dry. No rash noted. No erythema. No pallor.  Psychiatric: She has a normal mood and affect.   Assessment and Plan :   Encounter for health maintenance examination in adult  Sickle cell trait (HCC)  Mild anemia  Prediabetes  Obesity (BMI 35.0-39.9 without comorbidity)  Discussed healthy lifestyle, diet, exercise, preventative care, vaccinations, and addressed patient's concerns.     Wallis Bamberg, PA-C [facility] at [facility] [phone] 05/08/2018  3:50 PM

## 2019-03-05 ENCOUNTER — Other Ambulatory Visit: Payer: Self-pay | Admitting: Internal Medicine

## 2019-03-05 MED ORDER — NITROFURANTOIN MONOHYD MACRO 100 MG PO CAPS: 100.0000 mg | ORAL_CAPSULE | Freq: Two times a day (BID) | ORAL | 0 refills | Status: DC

## 2019-03-05 NOTE — Progress Notes (Signed)
mm

## 2019-07-02 ENCOUNTER — Encounter: Payer: Self-pay | Admitting: Family Medicine

## 2019-07-02 ENCOUNTER — Ambulatory Visit (INDEPENDENT_AMBULATORY_CARE_PROVIDER_SITE_OTHER): Payer: Commercial Managed Care - PPO | Admitting: Family Medicine

## 2019-07-02 ENCOUNTER — Other Ambulatory Visit: Payer: Self-pay

## 2019-07-02 VITALS — BP 118/81 | HR 84 | Temp 98.4°F | Ht 65.0 in | Wt 207.4 lb

## 2019-07-02 DIAGNOSIS — Z0000 Encounter for general adult medical examination without abnormal findings: Secondary | ICD-10-CM

## 2019-07-02 DIAGNOSIS — R718 Other abnormality of red blood cells: Secondary | ICD-10-CM | POA: Diagnosis not present

## 2019-07-02 DIAGNOSIS — R7303 Prediabetes: Secondary | ICD-10-CM

## 2019-07-02 DIAGNOSIS — Z1231 Encounter for screening mammogram for malignant neoplasm of breast: Secondary | ICD-10-CM

## 2019-07-02 DIAGNOSIS — Z1329 Encounter for screening for other suspected endocrine disorder: Secondary | ICD-10-CM

## 2019-07-02 DIAGNOSIS — Z0001 Encounter for general adult medical examination with abnormal findings: Secondary | ICD-10-CM | POA: Diagnosis not present

## 2019-07-02 NOTE — Progress Notes (Signed)
Chief Complaint  Patient presents with  . Annual Exam    CPE    Subjective:  Sonya Harrison is a 38 y.o. female here for a health maintenance visit.  Patient is established pt     Patient Active Problem List   Diagnosis Date Noted  . Pregnancy 04/06/2016    Past Medical History:  Diagnosis Date  . Anemia   . Blood transfusion without reported diagnosis   . GERD (gastroesophageal reflux disease)    tums prn  . Incompetent cervix   . 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 Sickle cell trait [facility])     Past Surgical History:  Procedure Laterality Date  . APPENDECTOMY  04/08/2011  . CERVICAL CERCLAGE     x2  . CERVICAL CERCLAGE  04/19/2012   Procedure: CERCLAGE CERVICAL;  Surgeon: [name], MD;  Location: [facility];  Service: Gynecology;  Laterality: N/A;  . CERVICAL CERCLAGE N/A 10/14/2015   Procedure: CERCLAGE CERVICAL;  Surgeon: Richarda Overlie, MD;  Location: [facility];  Service: Gynecology;  Laterality: N/A;  . CESAREAN SECTION  03/07, 04/09, 12/11  . CESAREAN SECTION    . CESAREAN SECTION    . CESAREAN SECTION WITH BILATERAL TUBAL LIGATION Bilateral 04/06/2016   Procedure: CESAREAN SECTION WITH BILATERAL TUBAL LIGATION, CERCLAGE REMOVAL;  Surgeon: Richarda Overlie, MD;  Location: [facility] BIRTHING SUITES;  Service: Obstetrics;  Laterality: Bilateral;  EDC 04/13/16 Curly Rim, RNFA  . DILATION AND CURETTAGE OF UTERUS  05/23/2012   Procedure: DILATATION AND CURETTAGE;  Surgeon: Leslie Andrea, MD;  Location: [facility];  Service: Gynecology;  Laterality: N/A;     Outpatient Medications Prior to Visit  Medication Sig Dispense Refill  . acetaminophen (TYLENOL) 325 MG tablet Take 650 mg by mouth every 6 (six) hours as needed for mild pain or headache.    . ferrous sulfate 325 (65 FE) MG tablet Take 325 mg by mouth daily with breakfast.    . ibuprofen (ADVIL,MOTRIN) 800 MG tablet Take 1 tablet (800 mg total) by mouth every 8 (eight) hours as needed  for moderate pain. (Patient not taking: Reported on 12/10/2017) 30 tablet 0  . nitrofurantoin, macrocrystal-monohydrate, (MACROBID) 100 MG capsule Take 1 capsule (100 mg total) by mouth 2 (two) times daily. (Patient not taking: Reported on 07/02/2019) 10 capsule 0  . Prenatal Vit-Fe Fumarate-FA (PRENATAL MULTIVITAMIN) TABS tablet Take 1 tablet by mouth daily at 12 noon.    . progesterone (PROMETRIUM) 200 MG capsule Place 1 capsule vaginally at bedtime.     . Vitamin D, Ergocalciferol, (DRISDOL) 50000 units CAPS capsule Take 1 capsule (50,000 Units total) by mouth every 7 (seven) days. (Patient not taking: Reported on 05/08/2018) 30 capsule 0   No facility-administered medications prior to visit.     Allergies  Allergen Reactions  . Contrast Media [Iodinated Diagnostic Agents] Hives     Family History  Problem Relation Age of Onset  . Hypertension Mother   . Hypertension Father      Health Habits: Dental Exam: up to date Eye Exam: up to date Exercise: 0 times/week on average Current exercise activities: walking/running Diet: balanced Wt Readings from Last 3 Encounters:  07/02/19 207 lb 6.4 oz (94.1 kg)  05/08/18 212 lb 3.2 oz (96.3 kg)  12/10/17 199 lb 3.2 oz (90.4 kg)     Social History   Socioeconomic History  . Marital status: Married    Spouse name: Not on file  . Number  of children: Not on file  . Years of education: Not on file  . Highest education level: Not on file  Occupational History  . Not on file  Social Needs  . Financial resource strain: Not on file  . Food insecurity    Worry: Not on file    Inability: Not on file  . Transportation needs    Medical: Not on file    Non-medical: Not on file  Tobacco Use  . Smoking status: Never Smoker  . Smokeless tobacco: Never Used  Substance and Sexual Activity  . Alcohol use: No  . Drug use: No  . Sexual activity: Yes    Birth control/protection: None    Comment: pregnant  Lifestyle  . Physical activity     Days per week: Not on file    Minutes per session: Not on file  . Stress: Not on file  Relationships  . Social Musicianconnections    Talks on phone: Not on file    Gets together: Not on file    Attends religious service: Not on file    Active member of club or organization: Not on file    Attends meetings of clubs or organizations: Not on file    Relationship status: Not on file  . Intimate partner violence    Fear of current or ex partner: Not on file    Emotionally abused: Not on file    Physically abused: Not on file    Forced sexual activity: Not on file  Other Topics Concern  . Not on file  Social History Narrative  . Not on file   Social History   Substance and Sexual Activity  Alcohol Use No   Social History   Tobacco Use  Smoking Status Never Smoker  Smokeless Tobacco Never Used   Social History   Substance and Sexual Activity  Drug Use No    GYN: Sexual Health Menstrual status: regular menses LMP: Patient's last menstrual period was 07/02/2019. Last pap smear: see HM section History of abnormal pap smears:  Sexually active: ** with ** partner Current contraception:   Health Maintenance: See under health Maintenance activity for review of completion dates as well. There is no immunization history for the selected administration types on file for this patient.    Depression Screen-PHQ2/9 Depression screen [facility] 2/9 07/02/2019 05/08/2018 12/10/2017 05/10/2017 01/12/2016  Decreased Interest 0 0 0 0 0  Down, Depressed, Hopeless 0 0 0 0 0  PHQ - 2 Score 0 0 0 0 0       Depression Severity and Treatment Recommendations:  0-4= None  5-9= Mild / Treatment: Support, educate to call if worse; return in one month  10-14= Moderate / Treatment: Support, watchful waiting; Antidepressant or Psycotherapy  15-19= Moderately severe / Treatment: Antidepressant OR Psychotherapy  >= 20 = Major depression, severe / Antidepressant AND Psychotherapy    Review of Systems    ROS  See HPI for ROS as well.   Review of Systems  Constitutional: Negative for activity change, appetite change, chills and fever.  HENT: Negative for congestion, nosebleeds, trouble swallowing and voice change.   Respiratory: Negative for cough, shortness of breath and wheezing.   Gastrointestinal: Negative for diarrhea, nausea and vomiting.  Genitourinary: Negative for difficulty urinating, dysuria, flank pain and hematuria.  Musculoskeletal: Negative for back pain, joint swelling and neck pain.  Neurological: Negative for dizziness, speech difficulty, light-headedness and numbness.  See HPI. All other review of systems negative.   Objective:  Vitals:   07/02/19 1404  BP: 118/81  Pulse: 84  Temp: 98.4 F (36.9 C)  TempSrc: Oral  SpO2: 97%  Weight: 207 lb 6.4 oz (94.1 kg)  Height: 5\' 5"  (1.651 m)    Body mass index is 34.51 kg/m.  Physical Exam  BP 118/81   Pulse 84   Temp 98.4 F (36.9 C) (Oral)   Ht 5\' 5"  (1.651 m)   Wt 207 lb 6.4 oz (94.1 kg)   LMP 07/02/2019   SpO2 97%   BMI 34.51 kg/m   General Appearance:    Alert, cooperative, no distress, appears stated age  Head:    Normocephalic, without obvious abnormality, atraumatic  Eyes:    PERRL, conjunctiva/corneas clear, EOM's intact  Ears:    Normal TM's and external ear canals, both ears  Nose:   Nares normal, septum midline, mucosa normal, no drainage    or sinus tenderness  Throat:   Lips, mucosa, and tongue normal; teeth and gums normal  Neck:   Supple, symmetrical, trachea midline  Back:     Symmetric, no curvature, ROM normal, no CVA tenderness  Lungs:     Clear to auscultation bilaterally, respirations unlabored  Chest Wall:    No tenderness or deformity   Heart:    Regular rate and rhythm, S1 and S2 normal, no murmur, rub   or gallop  Abdomen:     Soft, non-tender, bowel sounds active all four quadrants,    no masses, no organomegaly  Extremities:   Extremities normal, atraumatic, no cyanosis  or edema  Pulses:   2+ and symmetric all extremities  Skin:   Skin color, texture, turgor normal, no rashes or lesions  Lymph nodes:   Cervical, supraclavicular, and axillary nodes normal  Neurologic:   CNII-XII intact, normal strength, sensation and reflexes    throughout      Assessment/Plan:   Patient was seen for a health maintenance exam.  Counseled the patient on health maintenance issues. Reviewed her health mainteance schedule and ordered appropriate tests (see orders.) Counseled on regular exercise and weight management. Recommend regular eye exams and dental cleaning.   The following issues were addressed today for health maintenance:   There are no diagnoses linked to this encounter.  No follow-ups on file.    Body mass index is 34.51 kg/m.:  Discussed the patient's BMI with patient. The BMI body mass index is 34.51 kg/m.     No future appointments.  Patient Instructions       If you have lab work done today you will be contacted with your lab results within the next 2 weeks.  If you have not heard from [place] then please contact us. The fastest way to get your results is to register for My Chart.   IF you received an x-ray today, you will receive an invoice from [facility]. Please contact [facility] at [phone] with questions or concerns regarding your invoice.   IF you received labwork today, you will receive an invoice from [facility]. Please contact [facility] at [phone] with questions or concerns regarding your invoice.   Our billing staff will not be able to assist you with questions regarding bills from these companies.  You will be contacted with the lab results as soon as they are available. The fastest way to get your results is to activate your My Chart account. Instructions are located on the last page of this paperwork. If you have not heard from [place] regarding the results  in 2 weeks, please contact this office.

## 2019-07-02 NOTE — Patient Instructions (Addendum)
We recommend that you schedule a mammogram for breast cancer screening. Typically, you do not need a referral to do this. Please contact a local imaging center to schedule your mammogram.   [facility] - [phone] or [phone]   Also try Slow Fe iron over the counter 2-3 times a week.    If you have lab work done today you will be contacted with your lab results within the next 2 weeks.  If you have not heard from us then please contact us. The fastest way to get your results is to register for My Chart.   IF you received an x-ray today, you will receive an invoice from [facility]. Please contact [facility] at [phone] with questions or concerns regarding your invoice.   IF you received labwork today, you will receive an invoice from [facility]. Please contact [facility] at [phone] with questions or concerns regarding your invoice.   Our billing staff will not be able to assist you with questions regarding bills from these companies.  You will be contacted with the lab results as soon as they are available. The fastest way to get your results is to activate your My Chart account. Instructions are located on the last page of this paperwork. If you have not heard from us regarding the results in 2 weeks, please contact this office.      Health Maintenance, Female Adopting a healthy lifestyle and getting preventive care are important in promoting health and wellness. Ask your health care provider about:  The right schedule for you to have regular tests and exams.  Things you can do on your own to prevent diseases and keep yourself healthy. What should I know about diet, weight, and exercise? Eat a healthy diet   Eat a diet that includes plenty of vegetables, fruits, low-fat dairy products, and lean protein.  Do not eat a lot of foods that are high in solid fats, added sugars, or sodium. Maintain a healthy weight Body  mass index (BMI) is used to identify weight problems. It estimates body fat based on height and weight. Your health care provider can help determine your BMI and help you achieve or maintain a healthy weight. Get regular exercise Get regular exercise. This is one of the most important things you can do for your health. Most adults should:  Exercise for at least 150 minutes each week. The exercise should increase your heart rate and make you sweat (moderate-intensity exercise).  Do strengthening exercises at least twice a week. This is in addition to the moderate-intensity exercise.  Spend less time sitting. Even light physical activity can be beneficial. Watch cholesterol and blood lipids Have your blood tested for lipids and cholesterol at 40 years of age, then have this test every 5 years. Have your cholesterol levels checked more often if:  Your lipid or cholesterol levels are high.  You are older than 40 years of age.  You are at high risk for heart disease. What should I know about cancer screening? Depending on your health history and family history, you may need to have cancer screening at various ages. This may include screening for:  Breast cancer.  Cervical cancer.  Colorectal cancer.  Skin cancer.  Lung cancer. What should I know about heart disease, diabetes, and high blood pressure? Blood pressure and heart disease  High blood pressure causes heart disease and increases the risk of stroke. This is more likely to develop in people who have  high blood pressure readings, are of African descent, or are overweight.  Have your blood pressure checked: ? Every 3-5 years if you are 65-57 years of age. ? Every year if you are 70 years old or older. Diabetes Have regular diabetes screenings. This checks your fasting blood sugar level. Have the screening done:  Once every three years after age 77 if you are at a normal weight and have a low risk for diabetes.  More often  and at a younger age if you are overweight or have a high risk for diabetes. What should I know about preventing infection? Hepatitis B If you have a higher risk for hepatitis B, you should be screened for this virus. Talk with your health care provider to find out if you are at risk for hepatitis B infection. Hepatitis C Testing is recommended for:  Everyone born from [birth year] through 1967.  Anyone with known risk factors for hepatitis C. Sexually transmitted infections (STIs)  Get screened for STIs, including gonorrhea and chlamydia, if: ? You are sexually active and are younger than 40 years of age. ? You are older than 40 years of age and your health care provider tells you that you are at risk for this type of infection. ? Your sexual activity has changed since you were last screened, and you are at increased risk for chlamydia or gonorrhea. Ask your health care provider if you are at risk.  Ask your health care provider about whether you are at high risk for HIV. Your health care provider may recommend a prescription medicine to help prevent HIV infection. If you choose to take medicine to prevent HIV, you should first get tested for HIV. You should then be tested every 3 months for as long as you are taking the medicine. Pregnancy  If you are about to stop having your period (premenopausal) and you may become pregnant, seek counseling before you get pregnant.  Take 400 to 800 micrograms (mcg) of folic acid every day if you become pregnant.  Ask for birth control (contraception) if you want to prevent pregnancy. Osteoporosis and menopause Osteoporosis is a disease in which the bones lose minerals and strength with aging. This can result in bone fractures. If you are 31 years old or older, or if you are at risk for osteoporosis and fractures, ask your health care provider if you should:  Be screened for bone loss.  Take a calcium or vitamin D supplement to lower your risk of  fractures.  Be given hormone replacement therapy (HRT) to treat symptoms of menopause. Follow these instructions at home: Lifestyle  Do not use any products that contain nicotine or tobacco, such as cigarettes, e-cigarettes, and chewing tobacco. If you need help quitting, ask your health care provider.  Do not use street drugs.  Do not share needles.  Ask your health care provider for help if you need support or information about quitting drugs. Alcohol use  Do not drink alcohol if: ? Your health care provider tells you not to drink. ? You are pregnant, may be pregnant, or are planning to become pregnant.  If you drink alcohol: ? Limit how much you use to 0-1 drink a day. ? Limit intake if you are breastfeeding.  Be aware of how much alcohol is in your drink. In the [place] one drink equals one 12 oz bottle of beer (355 mL), one 5 oz glass of wine (148 mL), or one 1 oz glass of hard liquor (44 mL).  General instructions  Schedule regular health, dental, and eye exams.  Stay current with your vaccines.  Tell your health care provider if: ? You often feel depressed. ? You have ever been abused or do not feel safe at home. Summary  Adopting a healthy lifestyle and getting preventive care are important in promoting health and wellness.  Follow your health care provider's instructions about healthy diet, exercising, and getting tested or screened for diseases.  Follow your health care provider's instructions on monitoring your cholesterol and blood pressure. This information is not intended to replace advice given to you by your health care provider. Make sure you discuss any questions you have with your health care provider. Document Released: 02/13/2011 Document Revised: 07/24/2018 Document Reviewed: 07/24/2018 Elsevier Patient Education  2020 [employer].

## 2019-07-03 LAB — CMP14+EGFR
ALT: 18 IU/L (ref 0–32)
AST: 19 IU/L (ref 0–40)
Albumin/Globulin Ratio: 1.1 — ABNORMAL LOW (ref 1.2–2.2)
Albumin: 4.1 g/dL (ref 3.8–4.8)
Alkaline Phosphatase: 71 IU/L (ref 39–117)
BUN/Creatinine Ratio: 18 (ref 9–23)
BUN: 13 mg/dL (ref 6–24)
Bilirubin Total: 0.4 mg/dL (ref 0.0–1.2)
CO2: 24 mmol/L (ref 20–29)
Calcium: 9.5 mg/dL (ref 8.7–10.2)
Chloride: 100 mmol/L (ref 96–106)
Creatinine, Ser: 0.71 mg/dL (ref 0.57–1.00)
GFR calc Af Amer: 123 mL/min/{1.73_m2} (ref >59)
GFR calc non Af Amer: 107 mL/min/{1.73_m2} (ref >59)
Globulin, Total: 3.6 g/dL (ref 1.5–4.5)
Glucose: 81 mg/dL (ref 65–99)
Potassium: 4.3 mmol/L (ref 3.5–5.2)
Sodium: 138 mmol/L (ref 134–144)
Total Protein: 7.7 g/dL (ref 6.0–8.5)

## 2019-07-03 LAB — CBC
Hematocrit: 37.2 % (ref 34.0–46.6)
Hemoglobin: 11.9 g/dL (ref 11.1–15.9)
MCH: 26 pg — ABNORMAL LOW (ref 26.6–33.0)
MCHC: 32 g/dL (ref 31.5–35.7)
MCV: 81 fL (ref 79–97)
Platelets: 448 10*3/uL (ref 150–450)
RBC: 4.57 x10E6/uL (ref 3.77–5.28)
RDW: 14.4 % (ref 11.7–15.4)
WBC: 8.4 10*3/uL (ref 3.4–10.8)

## 2019-07-03 LAB — TSH: TSH: 1.06 u[IU]/mL (ref 0.450–4.500)

## 2021-03-22 ENCOUNTER — Ambulatory Visit (HOSPITAL_COMMUNITY)
Admission: EM | Admit: 2021-03-22 | Discharge: 2021-03-22 | Disposition: A | Discharge status: Home / Self Care | Payer: Commercial Managed Care - PPO | Attending: Family Medicine | Admitting: Family Medicine

## 2021-03-22 ENCOUNTER — Ambulatory Visit (INDEPENDENT_AMBULATORY_CARE_PROVIDER_SITE_OTHER): Payer: Commercial Managed Care - PPO

## 2021-03-22 ENCOUNTER — Encounter (HOSPITAL_COMMUNITY): Payer: Self-pay | Admitting: Emergency Medicine

## 2021-03-22 DIAGNOSIS — Z111 Encounter for screening for respiratory tuberculosis: Secondary | ICD-10-CM

## 2021-03-22 DIAGNOSIS — R7611 Nonspecific reaction to tuberculin skin test without active tuberculosis: Secondary | ICD-10-CM | POA: Diagnosis not present

## 2021-03-22 NOTE — ED Provider Notes (Signed)
[facility]   [id number] 03/22/21 Arrival Time: 1724  ASSESSMENT & PLAN:  1. Positive PPD    I have personally viewed the imaging studies ordered this visit. No signs of TB.  Copy of radiology report provided.   Follow-up Information     Doristine Bosworth, MD.   Specialty: Internal Medicine Why: As needed. Contact information: [address] [place] [address] [phone]                 Reviewed expectations re: course of current medical issues. Questions answered. Outlined signs and symptoms indicating need for more acute intervention. Understanding verbalized. After Visit Summary given.   SUBJECTIVE: History from: patient. Sonya Harrison is a 40 y.o. female who reports having BCG vaccine as child. Needs CXR to r/o TB for school. No symptoms.   OBJECTIVE:  Vitals:   03/22/21 1757  BP: 130/78  Pulse: 97  Resp: 17  Temp: 98.9 F (37.2 C)  TempSrc: Oral  SpO2: 98%    General appearance: alert; no distress Lungs: speaks full sentences without difficulty; unlabored Psychological: alert and cooperative; normal mood and affect  Imaging: DG Chest 2 View  Result Date: 03/22/2021 CLINICAL DATA:  TB screening. EXAM: CHEST - 2 VIEW COMPARISON:  11/13/2010 FINDINGS: Heart and mediastinal contours are within normal limits. No focal opacities or effusions. No acute bony abnormality. IMPRESSION: No active cardiopulmonary disease. Electronically Signed   By: Charlett Nose M.D.   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    Sickle cell trait (HCC)    Social History   Socioeconomic History   Marital status:  Married    Spouse name: Not on file   Number of children: Not on file   Years of education: Not on file   Highest education level: Not on file  Occupational History   Not on file  Tobacco Use   Smoking status: Never   Smokeless tobacco: Never  Substance and Sexual Activity   Alcohol use: No   Drug use: No   Sexual activity: Yes    Birth control/protection: None    Comment: pregnant  Other Topics Concern   Not on file  Social History Narrative   Not on file   Social Determinants of Health   Financial Resource Strain: Not on file  Food Insecurity: Not on file  Transportation Needs: Not on file  Physical Activity: Not on file  Stress: Not on file  Social Connections: Not on file  Intimate Partner Violence: Not on file   Family History  Problem Relation Age of Onset   Hypertension Mother    Hypertension Father    Past Surgical History:  Procedure Laterality Date   APPENDECTOMY  04/08/2011   CERVICAL CERCLAGE     x2   CERVICAL CERCLAGE  04/19/2012   Procedure: CERCLAGE CERVICAL;  Surgeon: [name], MD;  Location: [facility];  Service: Gynecology;  Laterality: N/A;   CERVICAL CERCLAGE N/A 10/14/2015   Procedure: CERCLAGE CERVICAL;  Surgeon: Richarda Overlie, MD;  Location: [facility];  Service: Gynecology;  Laterality: N/A;   CESAREAN SECTION  03/07,  04/09, 12/11   CESAREAN SECTION     CESAREAN SECTION     CESAREAN SECTION WITH BILATERAL TUBAL LIGATION Bilateral 04/06/2016   Procedure: CESAREAN SECTION WITH BILATERAL TUBAL LIGATION, CERCLAGE REMOVAL;  Surgeon: Richarda Overlie, MD;  Location: [facility] BIRTHING SUITES;  Service: Obstetrics;  Laterality: Bilateral;  EDC 04/13/16 Curly Rim, RNFA   DILATION AND CURETTAGE OF UTERUS  05/23/2012   Procedure: DILATATION AND CURETTAGE;  Surgeon: Leslie Andrea, MD;  Location: [facility];  Service: Gynecology;  Laterality: N/AMardella Layman, MD 03/22/21 [phone]

## 2021-03-22 NOTE — ED Triage Notes (Signed)
Pt presents for Chest X-ray for school due to history of positive skin test.

## 2021-08-03 ENCOUNTER — Other Ambulatory Visit: Payer: Self-pay | Admitting: Internal Medicine

## 2021-08-03 MED ORDER — NITROFURANTOIN MONOHYD MACRO 100 MG PO CAPS: 100.0000 mg | ORAL_CAPSULE | Freq: Two times a day (BID) | ORAL | 0 refills | Status: DC

## 2021-08-03 MED ORDER — FLUCONAZOLE 150 MG PO TABS: 150.0000 mg | ORAL_TABLET | Freq: Once | ORAL | 0 refills | Status: AC

## 2023-01-22 ENCOUNTER — Encounter (HOSPITAL_COMMUNITY): Payer: Self-pay | Admitting: *Deleted

## 2023-01-22 ENCOUNTER — Ambulatory Visit (HOSPITAL_COMMUNITY)
Admission: EM | Admit: 2023-01-22 | Discharge: 2023-01-22 | Disposition: A | Discharge status: Home / Self Care | Payer: Managed Care, Other (non HMO) | Attending: Physician Assistant | Admitting: Physician Assistant

## 2023-01-22 DIAGNOSIS — R310 Gross hematuria: Secondary | ICD-10-CM | POA: Diagnosis present

## 2023-01-22 DIAGNOSIS — R300 Dysuria: Secondary | ICD-10-CM | POA: Diagnosis not present

## 2023-01-22 LAB — POCT URINALYSIS DIP (MANUAL ENTRY)
Bilirubin, UA: NEGATIVE
Glucose, UA: NEGATIVE mg/dL
Ketones, POC UA: NEGATIVE mg/dL
Leukocytes, UA: NEGATIVE
Nitrite, UA: NEGATIVE
Protein Ur, POC: NEGATIVE mg/dL
Spec Grav, UA: 1.02 (ref 1.010–1.025)
Urobilinogen, UA: 0.2 E.U./dL — AB
pH, UA: 7 (ref 5.0–8.0)

## 2023-01-22 LAB — POCT URINE PREGNANCY: Preg Test, Ur: NEGATIVE

## 2023-01-22 MED ORDER — PHENAZOPYRIDINE HCL 100 MG PO TABS: 100.0000 mg | ORAL_TABLET | Freq: Three times a day (TID) | ORAL | 0 refills | Status: AC | PRN

## 2023-01-22 NOTE — ED Triage Notes (Addendum)
C/O hematuria and dysuria onset yesterday with low abd pain. Denies fevers. Denies vaginal discharge.

## 2023-01-22 NOTE — ED Provider Notes (Signed)
[facility]    CSN: [id number] Arrival date & time: 01/22/23  1320      History   Chief Complaint Chief Complaint  Patient presents with   Hematuria   Dysuria    HPI Sonya Harrison is a 42 y.o. female.   Patient presents today with a 24-hour history of UTI symptoms.  Reports that she had gross hematuria, dysuria, lower abdominal pain.  She push fluids and the symptoms have improved but continues to have some discomfort and just feeling poorly.  Denies any nausea, vomiting, fever, pelvic pain, vaginal symptoms.  Does report that she had intercourse before symptoms began.  Denies history of recurrent urinary tract infection, nephrolithiasis, single kidney, seeing a urologist.  Denies any recent urogenital procedure or catheterization.  Reports that she is borderline diabetic but does not take any medication including SGLT2 inhibitor.  Denies any recent antibiotics in the past 90 days.    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    Sickle cell trait [facility])     Patient Active Problem List   Diagnosis Date Noted   Pregnancy 04/06/2016    Past Surgical History:  Procedure Laterality Date   APPENDECTOMY  04/08/2011   CERVICAL CERCLAGE     x2   CERVICAL CERCLAGE  04/19/2012   Procedure: CERCLAGE CERVICAL;  Surgeon: [name], MD;  Location: [facility];  Service: Gynecology;  Laterality: N/A;   CERVICAL CERCLAGE N/A 10/14/2015   Procedure: CERCLAGE CERVICAL;  Surgeon: Richarda Overlie, MD;  Location: [facility];  Service: Gynecology;  Laterality: N/A;   CESAREAN SECTION  03/07, 04/09, 12/11   CESAREAN SECTION     CESAREAN SECTION     CESAREAN SECTION WITH BILATERAL TUBAL LIGATION Bilateral 04/06/2016   Procedure: CESAREAN SECTION WITH BILATERAL TUBAL LIGATION,  CERCLAGE REMOVAL;  Surgeon: Richarda Overlie, MD;  Location: [facility];  Service: Obstetrics;  Laterality: Bilateral;  EDC 04/13/16 Curly Rim, RNFA   DILATION AND CURETTAGE OF UTERUS  05/23/2012   Procedure: DILATATION AND CURETTAGE;  Surgeon: Leslie Andrea, MD;  Location: [facility];  Service: Gynecology;  Laterality: N/A;   TUBAL LIGATION      OB History     Gravida  7   Para  4   Term  3   Preterm  1   AB  3   Living  4      SAB  2   IAB  1   Ectopic  0   Multiple  0   Live Births  4            Home Medications    Prior to Admission medications   Medication Sig Start Date End Date Taking? Authorizing Provider  phenazopyridine (PYRIDIUM) 100 MG tablet Take 1 tablet (100 mg total) by mouth 3 (three) times daily as needed for pain. 01/22/23  Yes Taniela Feltus, Noberto Retort, PA-C    Family History Family History  Problem Relation Age of Onset   Hypertension Mother    Hypertension Father     Social History Social History   Tobacco Use   Smoking status: Never   Smokeless tobacco: Never  Vaping Use   Vaping Use: Never used  Substance Use Topics   Alcohol use: Yes  Comment: 3x/wk   Drug use: No     Allergies   Contrast media [iodinated contrast media]   Review of Systems Review of Systems  Constitutional:  Positive for activity change. Negative for appetite change, fatigue and fever.  Gastrointestinal:  Negative for abdominal pain, diarrhea, nausea and vomiting.  Genitourinary:  Positive for dysuria, frequency and hematuria. Negative for urgency, vaginal bleeding, vaginal discharge and vaginal pain.     Physical Exam Triage Vital Signs ED Triage Vitals [01/22/23 1348]  Enc Vitals Group     BP 125/82     Pulse Rate 96     Resp 18     Temp 98.5 F (36.9 C)     Temp Source Oral     SpO2 98 %     Weight      Height      Head Circumference      Peak Flow      Pain Score 0     Pain Loc      Pain Edu?      Excl. in GC?    No data  found.  Updated Vital Signs BP 125/82   Pulse 96   Temp 98.5 F (36.9 C) (Oral)   Resp 18   LMP 01/08/2023 (Approximate)   SpO2 98%   Breastfeeding No   Visual Acuity Right Eye Distance:   Left Eye Distance:   Bilateral Distance:    Right Eye Near:   Left Eye Near:    Bilateral Near:     Physical Exam Vitals reviewed.  Constitutional:      General: She is awake. She is not in acute distress.    Appearance: Normal appearance. She is well-developed. She is not ill-appearing.     Comments: Very pleasant female appears stated age in no acute distress sitting comfortably in exam room  HENT:     Head: Normocephalic and atraumatic.  Cardiovascular:     Rate and Rhythm: Normal rate and regular rhythm.     Heart sounds: Normal heart sounds, S1 normal and S2 normal. No murmur heard. Pulmonary:     Effort: Pulmonary effort is normal.     Breath sounds: Normal breath sounds. No wheezing, rhonchi or rales.     Comments: Clear to auscultation bilaterally Abdominal:     General: Bowel sounds are normal.     Palpations: Abdomen is soft.     Tenderness: There is abdominal tenderness in the suprapubic area. There is no right CVA tenderness, left CVA tenderness, guarding or rebound.     Comments: Mild tenderness palpation in suprapubic region.  No evidence of acute abdomen on physical exam.  Psychiatric:        Behavior: Behavior is cooperative.      UC Treatments / Results  Labs (all labs ordered are listed, but only abnormal results are displayed) Labs Reviewed  POCT URINALYSIS DIP (MANUAL ENTRY) - Abnormal; Notable for the following components:      Result Value   Blood, UA moderate (*)    Urobilinogen, UA 0.2 (*)    All other components within normal limits  URINE CULTURE  POCT URINE PREGNANCY    EKG   Radiology No results found.  Procedures Procedures (including critical care time)  Medications Ordered in UC Medications - No data to display  Initial  Impression / Assessment and Plan / UC Course  I have reviewed the triage vital signs and the nursing notes.  Pertinent labs & imaging results that were available  during my care of the patient were reviewed by me and considered in my medical decision making (see chart for details).     Patient is well-appearing, afebrile, nontoxic, nontachycardic.  Vital signs and physical exam are reassuring with no indication for emergent evaluation or imaging.  Urine pregnancy was negative.  UA did show blood but no additional evidence of infection.  Will send this for culture to ensure there is no infection given her symptoms but defer antibiotics until results are available.  Encouraged her to push fluids and use over-the-counter medications as needed for pain relief.  Pyridium was sent for comfort.  Discussed that if her symptoms are changing or worsening in any way and she has increasing pain, flank pain, abdominal pain, fever, recurrent blood in her urine she needs to be seen immediately.  Strict return precautions given.  Excuse note provided.  Final Clinical Impressions(s) / UC Diagnoses   Final diagnoses:  Dysuria  Gross hematuria     Discharge Instructions      Your urine had a little bit of blood but was otherwise normal with no evidence of infection.  We are going to send this for culture and we will contact you if it is positive and we need to arrange treatment with antibiotics.  Continue drinking plenty of fluid.  I do recommend that you follow-up with urology; call to schedule an appointment.  If anything worsens and you have fever, worsening pain, nausea, vomiting you need to be seen immediately.     ED Prescriptions     Medication Sig Dispense Auth. Provider   phenazopyridine (PYRIDIUM) 100 MG tablet Take 1 tablet (100 mg total) by mouth 3 (three) times daily as needed for pain. 10 tablet Iria Jamerson, Noberto Retort, PA-C      PDMP not reviewed this encounter.   Jeani Hawking, PA-C 01/22/23  1427

## 2023-01-22 NOTE — Discharge Instructions (Signed)
Your urine had a little bit of blood but was otherwise normal with no evidence of infection.  We are going to send this for culture and we will contact you if it is positive and we need to arrange treatment with antibiotics.  Continue drinking plenty of fluid.  I do recommend that you follow-up with urology; call to schedule an appointment.  If anything worsens and you have fever, worsening pain, nausea, vomiting you need to be seen immediately.

## 2023-01-23 LAB — URINE CULTURE: Culture: <10000 — AB

## 2024-03-14 ENCOUNTER — Ambulatory Visit: Admission: EM | Admit: 2024-03-14 | Discharge: 2024-03-14 | Disposition: A | Discharge status: Home / Self Care

## 2024-03-14 DIAGNOSIS — R030 Elevated blood-pressure reading, without diagnosis of hypertension: Secondary | ICD-10-CM

## 2024-03-14 DIAGNOSIS — D573 Sickle-cell trait: Secondary | ICD-10-CM | POA: Insufficient documentation

## 2024-03-14 DIAGNOSIS — E782 Mixed hyperlipidemia: Secondary | ICD-10-CM | POA: Insufficient documentation

## 2024-03-14 DIAGNOSIS — I10 Essential (primary) hypertension: Secondary | ICD-10-CM | POA: Insufficient documentation

## 2024-03-14 DIAGNOSIS — E669 Obesity, unspecified: Secondary | ICD-10-CM | POA: Insufficient documentation

## 2024-03-14 DIAGNOSIS — R519 Headache, unspecified: Secondary | ICD-10-CM | POA: Diagnosis not present

## 2024-03-14 DIAGNOSIS — N029 Recurrent and persistent hematuria with unspecified morphologic changes: Secondary | ICD-10-CM | POA: Insufficient documentation

## 2024-03-14 DIAGNOSIS — E559 Vitamin D deficiency, unspecified: Secondary | ICD-10-CM | POA: Insufficient documentation

## 2024-03-14 MED ORDER — HYDROCHLOROTHIAZIDE 12.5 MG PO TABS: 6.2500 mg | ORAL_TABLET | Freq: Every day | ORAL | 0 refills | Status: DC

## 2024-03-14 NOTE — ED Triage Notes (Signed)
 I have been having a nagging ha for a week now, I have been checking my BP it is elevated (highest reading 120 something/86-94). I have been checking this all week (BP's) about 2-3x per day. I have a PCP but I want established next door to you guys with a new one due to location of my current. No cough. No runny nose. No nausea. No vomiting. No new/unexplained rash.

## 2024-03-16 NOTE — ED Provider Notes (Signed)
 [facility]    CSN: [id number] Arrival date & time: 03/14/24  1439      History   Chief Complaint Chief Complaint  Patient presents with   Headache   Hypertension    HPI Sonya Harrison is a 43 y.o. female.   Patient here for evaluation of headaches several week.  She reports that she has been checking her blood pressure and it seems to be somewhat higher diastolically.  She was treated for blood pressure while she was pregnant but this resolved after about 6 months postpartum.  She reports that she is trying to stop from new PCP.  She has not had any runny nose or nausea or vomiting.  She denies any cough or rashes.  She has not had fever.  She does not report any vision changes.  The history is provided by the patient.  Headache Associated symptoms: no abdominal pain, no fever, no nausea, no numbness and no vomiting   Hypertension Associated symptoms include headaches. Pertinent negatives include no chest pain, no abdominal pain and no shortness of breath.    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    Sickle cell trait [facility])     Patient Active Problem List   Diagnosis Date Noted   Benign hematuria 03/14/2024   Essential hypertension 03/14/2024   Mixed hyperlipidemia 03/14/2024   Obesity 03/14/2024   Sickle cell trait (HCC) 03/14/2024   Vitamin D  deficiency 03/14/2024   Pregnancy 04/06/2016   Cervical insufficiency during pregnancy in second trimester, antepartum 11/11/2015    Past Surgical History:  Procedure Laterality Date   APPENDECTOMY  04/08/2011   CERVICAL CERCLAGE     x2   CERVICAL CERCLAGE  04/19/2012   Procedure: CERCLAGE CERVICAL;  Surgeon: Charlie CHRISTELLA Croak, MD;  Location: [facility];  Service: Gynecology;  Laterality: N/A;   CERVICAL  CERCLAGE N/A 10/14/2015   Procedure: CERCLAGE CERVICAL;  Surgeon: Charlie Croak, MD;  Location: [facility];  Service: Gynecology;  Laterality: N/A;   CESAREAN SECTION  03/07, 04/09, 12/11   CESAREAN SECTION     CESAREAN SECTION     CESAREAN SECTION WITH BILATERAL TUBAL LIGATION Bilateral 04/06/2016   Procedure: CESAREAN SECTION WITH BILATERAL TUBAL LIGATION, CERCLAGE REMOVAL;  Surgeon: Charlie Croak, MD;  Location: [facility]TH[facility] SUIT[facility];  Service: Obstetrics;  Laterality: Bilateral;  EDC 04/13/16 Dwayne DASEN, RNFA   DILATION AND CURETTAGE OF UTERUS  05/23/2012   Procedure: DILATATION AND CURETTAGE;  Surgeon: Lynwood FORBES Curlene PONCE, MD;  Location: [facility];  Service: Gynecology;  Laterality: N/A;   TUBAL LIGATION      OB History     Gravida  13   Para  7   Term  5   Preterm  2   AB  6   Living  7      SAB  5   IAB  1   Ectopic  0   Multiple      Live Births  7            Home Medications    Prior to Admission medications   Medication Sig Start Date End Date Taking? Authorizing Provider  hydrochlorothiazide  (HYDRODIURIL ) 12.5 MG tablet Take 0.5 tablets (6.25 mg total) by mouth daily. 03/14/24 04/13/24 Yes Billy Asberry FALCON,  PA-C  JARDIANCE 25 MG TABS tablet Take 25 mg by mouth daily. I never took it. 09/20/23  Yes [provider]  rosuvastatin (CRESTOR) 10 MG tablet Take 10 mg by mouth daily. I did not take that either 09/17/23  Yes [provider]  phenazopyridine  (PYRIDIUM ) 100 MG tablet Take 1 tablet (100 mg total) by mouth 3 (three) times daily as needed for pain. 01/22/23   Raspet, Erin K, PA-C  Vitamin D , Ergocalciferol , (DRISDOL ) 1.25 MG (50000 UNIT) CAPS capsule Take 50,000 Units by mouth 2 (two) times a week. I take once in a while 09/17/23   [provider]    Family History Family History  Problem Relation Age of Onset   Hypertension Mother    Hypertension Father     Social History Social History   Tobacco Use   Smoking status: Never    Smokeless tobacco: Never  Vaping Use   Vaping status: Never Used  Substance Use Topics   Alcohol use: Yes    Comment: I have cut back a whole lot.   Drug use: No     Allergies   Iodinated contrast media   Review of Systems Review of Systems  Constitutional:  Negative for chills and fever.  Eyes:  Negative for discharge and redness.  Respiratory:  Negative for shortness of breath.   Cardiovascular:  Negative for chest pain.  Gastrointestinal:  Negative for abdominal pain, nausea and vomiting.  Neurological:  Positive for headaches. Negative for numbness.     Physical Exam Triage Vital Signs ED Triage Vitals  Encounter Vitals Group     BP 03/14/24 1504 137/88     Girls Systolic BP Percentile --      Girls Diastolic BP Percentile --      Boys Systolic BP Percentile --      Boys Diastolic BP Percentile --      Pulse Rate 03/14/24 1504 87     Resp 03/14/24 1504 18     Temp 03/14/24 1504 98.1 F (36.7 C)     Temp Source 03/14/24 1504 Oral     SpO2 03/14/24 1504 98 %     Weight 03/14/24 1500 210 lb (95.3 kg)     Height 03/14/24 1500 5' 4 (1.626 m)     Head Circumference --      Peak Flow --      Pain Score 03/14/24 1454 7     Pain Loc --      Pain Education --      Exclude from Growth Chart --    No data found.  Updated Vital Signs BP 137/88 (BP Location: Left Arm)   Pulse 87   Temp 98.1 F (36.7 C) (Oral)   Resp 18   Ht 5' 4 (1.626 m)   Wt 210 lb (95.3 kg)   LMP 02/02/2024 (Exact Date)   SpO2 98%   BMI 36.05 kg/m   Visual Acuity Right Eye Distance:   Left Eye Distance:   Bilateral Distance:    Right Eye Near:   Left Eye Near:    Bilateral Near:     Physical Exam Vitals and nursing note reviewed.  Constitutional:      General: She is not in acute distress.    Appearance: Normal appearance. She is not ill-appearing.  HENT:     Head: Normocephalic and atraumatic.  Eyes:     Extraocular Movements: Extraocular movements intact.      Conjunctiva/sclera: Conjunctivae normal.  Pupils: Pupils are equal, round, and reactive to light.  Cardiovascular:     Rate and Rhythm: Normal rate and regular rhythm.  Pulmonary:     Effort: Pulmonary effort is normal. No respiratory distress.     Breath sounds: No wheezing, rhonchi or rales.  Neurological:     Mental Status: She is alert.  Psychiatric:        Mood and Affect: Mood normal.        Behavior: Behavior normal.        Thought Content: Thought content normal.      UC Treatments / Results  Labs (all labs ordered are listed, but only abnormal results are displayed) Labs Reviewed - No data to display  EKG   Radiology No results found.  Procedures Procedures (including critical care time)  Medications Ordered in UC Medications - No data to display  Initial Impression / Assessment and Plan / UC Course  I have reviewed the triage vital signs and the nursing notes.  Pertinent labs & imaging results that were available during my care of the patient were reviewed by me and considered in my medical decision making (see chart for details).    Discussed that blood pressure is mildly elevated in office, offered to start antihypertensive. Patient would like prescription for low dose hydrochlorothiazide  as she would like to avoid ACEi. Appointment made for establishing care in the upcoming weeks. Advised her to record her BP at home in the meantime. Recommend follow up in ED with any worsening headache or other new symptoms. Patient is agreeable with plan.   Final Clinical Impressions(s) / UC Diagnoses   Final diagnoses:  Frequent headaches  Elevated blood pressure reading without diagnosis of hypertension   Discharge Instructions   None    ED Prescriptions     Medication Sig Dispense Auth. Provider   hydrochlorothiazide  (HYDRODIURIL ) 12.5 MG tablet Take 0.5 tablets (6.25 mg total) by mouth daily. 15 tablet Billy Asberry FALCON, PA-C      PDMP not reviewed this  encounter.   Billy Asberry FALCON, PA-C 03/16/24 1334

## 2024-04-21 ENCOUNTER — Encounter: Payer: Self-pay | Admitting: Family

## 2024-04-21 ENCOUNTER — Ambulatory Visit (INDEPENDENT_AMBULATORY_CARE_PROVIDER_SITE_OTHER): Admitting: Family

## 2024-04-21 VITALS — BP 120/86 | HR 87 | Temp 98.1°F | Resp 16 | Ht 64.0 in | Wt 211.8 lb

## 2024-04-21 DIAGNOSIS — Z0130 Encounter for examination of blood pressure without abnormal findings: Secondary | ICD-10-CM

## 2024-04-21 DIAGNOSIS — I10 Essential (primary) hypertension: Secondary | ICD-10-CM | POA: Diagnosis not present

## 2024-04-21 DIAGNOSIS — Z7689 Persons encountering health services in other specified circumstances: Secondary | ICD-10-CM

## 2024-04-21 NOTE — Progress Notes (Signed)
 Patient wants to talk about her blood pressure

## 2024-04-21 NOTE — Progress Notes (Signed)
 Subjective:    Sonya Harrison - 43 y.o. female MRN [id number]  Date of birth: 01/23/1981  HPI  Sonya Harrison is to establish care.   Current issues and/or concerns: Patient seen on 03/14/2024 (1 hours) at [facility]) for elevated blood pressure reading and additional diagnosis. Today patient reports feeling improved. Patient states she only took Hydrochlorothiazide  one time due to causing low blood pressure and dizziness. Home blood pressures 110's/80's. She limits salt intake. She does not exercise outside of her normal routine. She does not complain of red flag symptoms such as but not limited to chest pain, shortness of breath, worst headache of life, nausea/vomiting.    ROS per HPI    Health Maintenance:  Health Maintenance Due  Topic Date Due   Hepatitis C Screening  Never done   Cervical Cancer Screening (HPV/Pap Cotest)  04/05/2018   Colonoscopy  Never done   Influenza Vaccine  03/14/2024     Past Medical History: Patient Active Problem List   Diagnosis Date Noted   Benign hematuria 03/14/2024   Essential hypertension 03/14/2024   Mixed hyperlipidemia 03/14/2024   Obesity 03/14/2024   Sickle cell trait (HCC) 03/14/2024   Vitamin D  deficiency 03/14/2024   Pregnancy 04/06/2016   Cervical insufficiency during pregnancy in second trimester, antepartum 11/11/2015      Social History   reports that she has never smoked. She has never used smokeless tobacco. She reports current alcohol use. She reports that she does not use drugs.   Family History  family history includes Hypertension in her father and mother.   Medications: reviewed and updated   Objective:   Physical Exam BP 120/86   Pulse 87   Temp 98.1 F (36.7 C) (Oral)   Resp 16   Ht 5' 4 (1.626 m)   Wt 211 lb 12.8 oz (96.1 kg)   SpO2 97%   BMI 36.36 kg/m   Physical Exam HENT:     Head: Normocephalic and atraumatic.     Nose: Nose normal.      Mouth/Throat:     Mouth: Mucous membranes are moist.     Pharynx: Oropharynx is clear.  Eyes:     Extraocular Movements: Extraocular movements intact.     Conjunctiva/sclera: Conjunctivae normal.     Pupils: Pupils are equal, round, and reactive to light.  Cardiovascular:     Rate and Rhythm: Normal rate and regular rhythm.     Pulses: Normal pulses.     Heart sounds: Normal heart sounds.  Pulmonary:     Effort: Pulmonary effort is normal.     Breath sounds: Normal breath sounds.  Musculoskeletal:        General: Normal range of motion.     Cervical back: Normal range of motion and neck supple.  Neurological:     General: No focal deficit present.     Mental Status: She is alert and oriented to person, place, and time.  Psychiatric:        Mood and Affect: Mood normal.        Behavior: Behavior normal.        Assessment & Plan:  1. Encounter to establish care (Primary) - Patient presents today to establish care. During the interim follow-up with primary provider as scheduled.  - Return for annual physical examination, labs, and health maintenance. Arrive fasting meaning having no food for at least 8 hours prior to appointment. You may have only water or  black coffee. Please take scheduled medications as normal.  2. Blood pressure check - Patient reports she self-discontinued Hydrochlorothiazide  due to hypotension and dizziness.  - Patient's blood pressure normal today in office.  - Counseled on blood pressure goal of less than 130/80, low-sodium, DASH diet, medication compliance, and 150 minutes of moderate intensity exercise per week as tolerated. Counseled on medication adherence and adverse effects. - Follow-up with primary provider as scheduled.   Patient was given clear instructions to go to Emergency Department or return to medical center if symptoms don't improve, worsen, or new problems develop.The patient verbalized understanding.  I discussed the assessment and  treatment plan with the patient. The patient was provided an opportunity to ask questions and all were answered. The patient agreed with the plan and demonstrated an understanding of the instructions.   The patient was advised to call back or seek an in-person evaluation if the symptoms worsen or if the condition fails to improve as anticipated.    Greig Drones, NP 04/21/2024, 11:39 AM Primary Care at [facility]

## 2024-04-24 ENCOUNTER — Ambulatory Visit: Admitting: Family Medicine

## 2024-06-06 ENCOUNTER — Encounter: Payer: Self-pay | Admitting: Family

## 2024-06-06 ENCOUNTER — Ambulatory Visit (INDEPENDENT_AMBULATORY_CARE_PROVIDER_SITE_OTHER): Admitting: Family

## 2024-06-06 VITALS — BP 138/94 | HR 93 | Temp 98.1°F | Resp 16 | Ht 64.0 in | Wt 210.6 lb

## 2024-06-06 DIAGNOSIS — Z8639 Personal history of other endocrine, nutritional and metabolic disease: Secondary | ICD-10-CM | POA: Diagnosis not present

## 2024-06-06 DIAGNOSIS — Z1329 Encounter for screening for other suspected endocrine disorder: Secondary | ICD-10-CM

## 2024-06-06 DIAGNOSIS — Z131 Encounter for screening for diabetes mellitus: Secondary | ICD-10-CM

## 2024-06-06 DIAGNOSIS — I10 Essential (primary) hypertension: Secondary | ICD-10-CM

## 2024-06-06 DIAGNOSIS — Z23 Encounter for immunization: Secondary | ICD-10-CM

## 2024-06-06 DIAGNOSIS — Z5320 Procedure and treatment not carried out because of patient's decision for unspecified reasons: Secondary | ICD-10-CM

## 2024-06-06 DIAGNOSIS — Z1159 Encounter for screening for other viral diseases: Secondary | ICD-10-CM

## 2024-06-06 DIAGNOSIS — Z130 Encounter for screening for diseases of the blood and blood-forming organs and certain disorders involving the immune mechanism: Secondary | ICD-10-CM

## 2024-06-06 DIAGNOSIS — Z13220 Encounter for screening for lipoid disorders: Secondary | ICD-10-CM

## 2024-06-06 DIAGNOSIS — Z1211 Encounter for screening for malignant neoplasm of colon: Secondary | ICD-10-CM

## 2024-06-06 DIAGNOSIS — Z0000 Encounter for general adult medical examination without abnormal findings: Secondary | ICD-10-CM

## 2024-06-06 DIAGNOSIS — Z87898 Personal history of other specified conditions: Secondary | ICD-10-CM

## 2024-06-06 DIAGNOSIS — Z13228 Encounter for screening for other metabolic disorders: Secondary | ICD-10-CM

## 2024-06-06 MED ORDER — HYDROCHLOROTHIAZIDE 12.5 MG PO TABS: 6.2500 mg | ORAL_TABLET | Freq: Every day | ORAL | 0 refills | Status: AC

## 2024-06-06 NOTE — Progress Notes (Signed)
 Patient ID: Sonya Harrison, female    DOB: 1981/04/26  MRN: [id number]  CC: Annual Exam  Subjective: Sonya Harrison is a 43 y.o. female who presents for annual exam.   Her concerns today include:  - Established with Gynecology for women's health maintenance/screenings.  - Due for colon cancer screening. Patient prefers Cologuard. - Reports history of prediabetes. Previously prescribed Jardiance and no longer taking. - Reports history of high cholesterol. States she never took Rosuvastatin.  - History of high blood pressure. Previously taking Hydrochlorothiazide . Home blood pressures above goal. She does not complain of red flag symptoms such as but not limited to chest pain, shortness of breath, worst headache of life, nausea/vomiting.   Patient Active Problem List   Diagnosis Date Noted   Benign hematuria 03/14/2024   Essential hypertension 03/14/2024   Mixed hyperlipidemia 03/14/2024   Obesity 03/14/2024   Sickle cell trait 03/14/2024   Vitamin D  deficiency 03/14/2024   Pregnancy 04/06/2016   Cervical insufficiency during pregnancy in second trimester, antepartum 11/11/2015     Current Outpatient Medications on File Prior to Visit  Medication Sig Dispense Refill   JARDIANCE 25 MG TABS tablet Take 25 mg by mouth daily. I never took it. (Patient not taking: Reported on 04/21/2024)     phenazopyridine  (PYRIDIUM ) 100 MG tablet Take 1 tablet (100 mg total) by mouth 3 (three) times daily as needed for pain. (Patient not taking: Reported on 04/21/2024) 10 tablet 0   rosuvastatin (CRESTOR) 10 MG tablet Take 10 mg by mouth daily. I did not take that either (Patient not taking: Reported on 04/21/2024)     Vitamin D , Ergocalciferol , (DRISDOL ) 1.25 MG (50000 UNIT) CAPS capsule Take 50,000 Units by mouth 2 (two) times a week. I take once in a while (Patient taking differently: Take 50,000 Units by mouth 2 (two) times a week. I take once in a while)     No current facility-administered  medications on file prior to visit.    Allergies  Allergen Reactions   Iodinated Contrast Media Hives    Other Reaction(s): Unknown    Social History   Socioeconomic History   Marital status: Married    Spouse name: Not on file   Number of children: Not on file   Years of education: Not on file   Highest education level: Not on file  Occupational History   Not on file  Tobacco Use   Smoking status: Never   Smokeless tobacco: Never  Vaping Use   Vaping status: Never Used  Substance and Sexual Activity   Alcohol use: Yes    Comment: I have cut back a whole lot.   Drug use: No   Sexual activity: Yes    Birth control/protection: Surgical  Other Topics Concern   Not on file  Social History Narrative   Not on file   Social Drivers of Health   Financial Resource Strain: Low Risk  (04/21/2024)   Overall Financial Resource Strain (CARDIA)    Difficulty of Paying Living Expenses: Not hard at all  Food Insecurity: No Food Insecurity (04/21/2024)   Hunger Vital Sign    Worried About Running Out of Food in the Last Year: Never true    Ran Out of Food in the Last Year: Never true  Transportation Needs: No Transportation Needs (04/21/2024)   PRAPARE - Administrator, Civil Service (Medical): No    Lack of Transportation (Non-Medical): No  Physical Activity: Insufficiently Active (04/21/2024)  Exercise Vital Sign    Days of Exercise per Week: 1 day    Minutes of Exercise per Session: 30 min  Stress: No Stress Concern Present (04/21/2024)   [employer] of Occupational Health - Occupational Stress Questionnaire    Feeling of Stress: Only a little  Social Connections: Moderately Isolated (04/21/2024)   Social Connection and Isolation Panel    Frequency of Communication with Friends and Family: More than three times a week    Frequency of Social Gatherings with Friends and Family: More than three times a week    Attends Religious Services: 1 to 4 times per year    Active  Member of [employer] or Organizations: No    Attends Banker Meetings: Never    Marital Status: Widowed  Intimate Partner Violence: Not At Risk (04/21/2024)   Humiliation, Afraid, Rape, and Kick questionnaire    Fear of Current or Ex-Partner: No    Emotionally Abused: No    Physically Abused: No    Sexually Abused: No    Family History  Problem Relation Age of Onset   Hypertension Mother    Hypertension Father     Past Surgical History:  Procedure Laterality Date   APPENDECTOMY  04/08/2011   CERVICAL CERCLAGE     x2   CERVICAL CERCLAGE  04/19/2012   Procedure: CERCLAGE CERVICAL;  Surgeon: Charlie CHRISTELLA Croak, MD;  Location: [facility];  Service: Gynecology;  Laterality: N/A;   CERVICAL CERCLAGE N/A 10/14/2015   Procedure: CERCLAGE CERVICAL;  Surgeon: Charlie Croak, MD;  Location: [facility];  Service: Gynecology;  Laterality: N/A;   CESAREAN SECTION  03/07, 04/09, 12/11   CESAREAN SECTION     CESAREAN SECTION     CESAREAN SECTION WITH BILATERAL TUBAL LIGATION Bilateral 04/06/2016   Procedure: CESAREAN SECTION WITH BILATERAL TUBAL LIGATION, CERCLAGE REMOVAL;  Surgeon: Charlie Croak, MD;  Location: [facility];  Service: Obstetrics;  Laterality: Bilateral;  EDC 04/13/16 Dwayne DASEN, RNFA   DILATION AND CURETTAGE OF UTERUS  05/23/2012   Procedure: DILATATION AND CURETTAGE;  Surgeon: Lynwood FORBES Curlene PONCE, MD;  Location: [facility];  Service: Gynecology;  Laterality: N/A;   TUBAL LIGATION      ROS: Review of Systems Negative except as stated above  PHYSICAL EXAM: BP (!) 138/94   Pulse 93   Temp 98.1 F (36.7 C)   Resp 16   Ht 5' 4 (1.626 m)   Wt 210 lb 9.6 oz (95.5 kg)   SpO2 95%   BMI 36.15 kg/m   Physical Exam HENT:     Head: Normocephalic and atraumatic.     Right Ear: Tympanic membrane, ear canal and external ear normal.     Left Ear: Tympanic membrane, ear canal and external ear normal.     Nose: Nose normal.     Mouth/Throat:     Mouth: Mucous membranes are  moist.     Pharynx: Oropharynx is clear.  Eyes:     Extraocular Movements: Extraocular movements intact.     Conjunctiva/sclera: Conjunctivae normal.     Pupils: Pupils are equal, round, and reactive to light.  Neck:     Thyroid: No thyroid mass, thyromegaly or thyroid tenderness.  Cardiovascular:     Rate and Rhythm: Normal rate and regular rhythm.     Pulses: Normal pulses.     Heart sounds: Normal heart sounds.  Pulmonary:     Effort: Pulmonary effort is normal.     Breath sounds: Normal breath sounds.  Chest:     Comments: Patient declined. Abdominal:     General: Bowel sounds are normal.     Palpations: Abdomen is soft.  Genitourinary:    Comments: Patient declined. Musculoskeletal:        General: Normal range of motion.     Right shoulder: Normal.     Left shoulder: Normal.     Right upper arm: Normal.     Left upper arm: Normal.     Right elbow: Normal.     Left elbow: Normal.     Right forearm: Normal.     Left forearm: Normal.     Right wrist: Normal.     Left wrist: Normal.     Right hand: Normal.     Left hand: Normal.     Cervical back: Normal, normal range of motion and neck supple.     Thoracic back: Normal.     Lumbar back: Normal.     Right hip: Normal.     Left hip: Normal.     Right upper leg: Normal.     Left upper leg: Normal.     Right knee: Normal.     Left knee: Normal.     Right lower leg: Normal.     Left lower leg: Normal.     Right ankle: Normal.     Left ankle: Normal.     Right foot: Normal.     Left foot: Normal.  Skin:    General: Skin is warm and dry.     Capillary Refill: Capillary refill takes less than 2 seconds.  Neurological:     General: No focal deficit present.     Mental Status: She is alert and oriented to person, place, and time.  Psychiatric:        Mood and Affect: Mood normal.        Behavior: Behavior normal.     ASSESSMENT AND PLAN: 1. Annual physical exam (Primary) - Counseled on 150 minutes of exercise  per week as tolerated, healthy eating (including decreased daily intake of saturated fats, cholesterol, added sugars, sodium), STI prevention, and routine healthcare maintenance.  2. Screening for metabolic disorder - Routine screening.  - CMP14+EGFR  3. Screening for deficiency anemia - Routine screening.  - CBC  4. Diabetes mellitus screening 5. History of prediabetes - Routine screening.  - Hemoglobin A1c  6. Screening cholesterol level 7. History of hyperlipidemia - Routine screening.  - Lipid panel  8. Thyroid disorder screen - Routine screening.  - TSH  9. Need for hepatitis C screening test - Routine screening.  - Hepatitis C Antibody  10. Mammogram declined - Patient declined.   11. Cervical cancer screening declined - Patient declined.   12. Colon cancer screening - Routine screening.  - Cologuard  13. Primary hypertension - Blood pressure not at goal during today's visit. Patient asymptomatic without chest pressure, chest pain, palpitations, shortness of breath, worst headache of life, and any additional red flag symptoms. - Hydrochlorothiazide  as prescribed.  - Counseled on blood pressure goal of less than 130/80, low-sodium, DASH diet, medication compliance, and 150 minutes of moderate intensity exercise per week as tolerated. Counseled on medication adherence and adverse effects. - Follow-up with primary provider in 4 weeks or sooner if needed.  - hydrochlorothiazide  (HYDRODIURIL ) 12.5 MG tablet; Take 0.5 tablets (6.25 mg total) by mouth daily.  Dispense: 45 tablet; Refill: 0  14. Immunization due - Administered.  - Flu vaccine trivalent PF, 6mos and older(Flulaval,Afluria,Fluarix,Fluzone)  Patient was given the opportunity to ask questions.  Patient verbalized understanding of the plan and was able to repeat key elements of the plan. Patient was given clear instructions to go to Emergency Department or return to medical center if symptoms don't  improve, worsen, or new problems develop.The patient verbalized understanding.   Orders Placed This Encounter  Procedures   Flu vaccine trivalent PF, 6mos and older(Flulaval,Afluria,Fluarix,Fluzone)   Hepatitis C Antibody   Cologuard   CBC   Lipid panel   CMP14+EGFR   Hemoglobin A1c   TSH     Requested Prescriptions   Signed Prescriptions Disp Refills   hydrochlorothiazide  (HYDRODIURIL ) 12.5 MG tablet 45 tablet 0    Sig: Take 0.5 tablets (6.25 mg total) by mouth daily.    Return in about 1 year (around 06/06/2025) for Physical per patient preference and 4 weeks or next available chronic conditions.  Greig JINNY Drones, NP

## 2024-06-07 LAB — CMP14+EGFR
ALT: 27 IU/L (ref 0–32)
AST: 23 IU/L (ref 0–40)
Albumin: 4.1 g/dL (ref 3.9–4.9)
Alkaline Phosphatase: 91 IU/L (ref 41–116)
BUN/Creatinine Ratio: 17 (ref 9–23)
BUN: 13 mg/dL (ref 6–24)
Bilirubin Total: 0.4 mg/dL (ref 0.0–1.2)
CO2: 25 mmol/L (ref 20–29)
Calcium: 9.3 mg/dL (ref 8.7–10.2)
Chloride: 102 mmol/L (ref 96–106)
Creatinine, Ser: 0.75 mg/dL (ref 0.57–1.00)
Globulin, Total: 3.6 g/dL (ref 1.5–4.5)
Glucose: 87 mg/dL (ref 70–99)
Potassium: 4.6 mmol/L (ref 3.5–5.2)
Sodium: 140 mmol/L (ref 134–144)
Total Protein: 7.7 g/dL (ref 6.0–8.5)
eGFR: 100 mL/min/1.73 (ref >59)

## 2024-06-07 LAB — LIPID PANEL
Chol/HDL Ratio: 3.7 ratio (ref 0.0–4.4)
Cholesterol, Total: 211 mg/dL — ABNORMAL HIGH (ref 100–199)
HDL: 57 mg/dL (ref >39)
LDL Chol Calc (NIH): 143 mg/dL — ABNORMAL HIGH (ref 0–99)
Triglycerides: 62 mg/dL (ref 0–149)
VLDL Cholesterol Cal: 11 mg/dL (ref 5–40)

## 2024-06-07 LAB — CBC
Hematocrit: 39 % (ref 34.0–46.6)
Hemoglobin: 12 g/dL (ref 11.1–15.9)
MCH: 25.2 pg — ABNORMAL LOW (ref 26.6–33.0)
MCHC: 30.8 g/dL — ABNORMAL LOW (ref 31.5–35.7)
MCV: 82 fL (ref 79–97)
Platelets: 443 x10E3/uL (ref 150–450)
RBC: 4.76 x10E6/uL (ref 3.77–5.28)
RDW: 15.8 % — ABNORMAL HIGH (ref 11.7–15.4)
WBC: 6.7 x10E3/uL (ref 3.4–10.8)

## 2024-06-07 LAB — HEPATITIS C ANTIBODY: Hep C Virus Ab: NONREACTIVE

## 2024-06-07 LAB — TSH: TSH: 1.09 u[IU]/mL (ref 0.450–4.500)

## 2024-06-07 LAB — HEMOGLOBIN A1C
Est. average glucose Bld gHb Est-mCnc: 126 mg/dL
Hgb A1c MFr Bld: 6 % — ABNORMAL HIGH (ref 4.8–5.6)

## 2024-06-09 ENCOUNTER — Ambulatory Visit: Payer: Self-pay | Admitting: Family

## 2024-06-09 DIAGNOSIS — Z13220 Encounter for screening for lipoid disorders: Secondary | ICD-10-CM

## 2024-07-06 LAB — COLOGUARD: COLOGUARD: NEGATIVE

## 2025-06-09 ENCOUNTER — Encounter: Admitting: Family
